# Patient Record
Sex: Female | Born: 1980 | Race: Black or African American | Hispanic: No | Marital: Single | Smoking: Never smoker
Health system: Southern US, Community
[De-identification: ages and names within clinical notes are randomized; demographics above are authoritative.]

## PROBLEM LIST (undated history)

## (undated) DIAGNOSIS — K649 Unspecified hemorrhoids: Secondary | ICD-10-CM

## (undated) DIAGNOSIS — M1990 Unspecified osteoarthritis, unspecified site: Secondary | ICD-10-CM

## (undated) DIAGNOSIS — T8859XA Other complications of anesthesia, initial encounter: Secondary | ICD-10-CM

## (undated) HISTORY — PX: TUBAL LIGATION: SHX77

## (undated) HISTORY — PX: OTHER SURGICAL HISTORY: SHX169

## (undated) HISTORY — PX: KNEE SURGERY: SHX244

---
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
## 1998-02-05 ENCOUNTER — Emergency Department (HOSPITAL_COMMUNITY): Admission: EM | Admit: 1998-02-05 | Discharge: 1998-02-05 | Payer: Self-pay | Admitting: Emergency Medicine

## 1998-05-17 ENCOUNTER — Emergency Department (HOSPITAL_COMMUNITY): Admission: EM | Admit: 1998-05-17 | Discharge: 1998-05-17 | Payer: Self-pay | Admitting: Emergency Medicine

## 2005-04-27 ENCOUNTER — Emergency Department (HOSPITAL_COMMUNITY): Admission: EM | Admit: 2005-04-27 | Discharge: 2005-04-27 | Payer: Self-pay | Admitting: *Deleted

## 2005-05-14 ENCOUNTER — Emergency Department (HOSPITAL_COMMUNITY): Admission: EM | Admit: 2005-05-14 | Discharge: 2005-05-15 | Payer: Self-pay | Admitting: Emergency Medicine

## 2005-07-26 ENCOUNTER — Emergency Department (HOSPITAL_COMMUNITY): Admission: EM | Admit: 2005-07-26 | Discharge: 2005-07-26 | Payer: Self-pay | Admitting: Family Medicine

## 2005-11-28 ENCOUNTER — Emergency Department (HOSPITAL_COMMUNITY): Admission: EM | Admit: 2005-11-28 | Discharge: 2005-11-29 | Payer: Self-pay | Admitting: Emergency Medicine

## 2005-12-19 ENCOUNTER — Other Ambulatory Visit: Admission: RE | Admit: 2005-12-19 | Discharge: 2005-12-19 | Payer: Self-pay | Admitting: Obstetrics and Gynecology

## 2006-04-01 ENCOUNTER — Inpatient Hospital Stay (HOSPITAL_COMMUNITY): Admission: AD | Admit: 2006-04-01 | Discharge: 2006-04-01 | Payer: Self-pay | Admitting: Obstetrics and Gynecology

## 2006-05-22 ENCOUNTER — Inpatient Hospital Stay (HOSPITAL_COMMUNITY): Admission: AD | Admit: 2006-05-22 | Discharge: 2006-05-22 | Payer: Self-pay | Admitting: Obstetrics and Gynecology

## 2006-05-26 ENCOUNTER — Inpatient Hospital Stay (HOSPITAL_COMMUNITY): Admission: AD | Admit: 2006-05-26 | Discharge: 2006-05-28 | Payer: Self-pay | Admitting: Obstetrics and Gynecology

## 2006-05-27 ENCOUNTER — Encounter (INDEPENDENT_AMBULATORY_CARE_PROVIDER_SITE_OTHER): Payer: Self-pay | Admitting: Specialist

## 2006-07-08 ENCOUNTER — Other Ambulatory Visit: Admission: RE | Admit: 2006-07-08 | Discharge: 2006-07-08 | Payer: Self-pay | Admitting: Obstetrics and Gynecology

## 2007-07-15 ENCOUNTER — Other Ambulatory Visit: Admission: RE | Admit: 2007-07-15 | Discharge: 2007-07-15 | Payer: Self-pay | Admitting: Obstetrics and Gynecology

## 2007-09-11 ENCOUNTER — Ambulatory Visit: Payer: Self-pay | Admitting: Family Medicine

## 2008-05-25 ENCOUNTER — Encounter: Admission: RE | Admit: 2008-05-25 | Discharge: 2008-05-25 | Payer: Self-pay | Admitting: Gastroenterology

## 2008-12-30 ENCOUNTER — Emergency Department (HOSPITAL_COMMUNITY): Admission: EM | Admit: 2008-12-30 | Discharge: 2008-12-30 | Payer: Self-pay | Admitting: Emergency Medicine

## 2009-04-03 ENCOUNTER — Emergency Department (HOSPITAL_COMMUNITY): Admission: EM | Admit: 2009-04-03 | Discharge: 2009-04-03 | Payer: Self-pay | Admitting: Emergency Medicine

## 2009-11-04 ENCOUNTER — Emergency Department (HOSPITAL_COMMUNITY): Admission: EM | Admit: 2009-11-04 | Discharge: 2009-11-04 | Payer: Self-pay | Admitting: Emergency Medicine

## 2009-12-15 ENCOUNTER — Emergency Department (HOSPITAL_COMMUNITY): Admission: EM | Admit: 2009-12-15 | Discharge: 2009-12-16 | Payer: Self-pay | Admitting: Emergency Medicine

## 2010-11-13 LAB — URINALYSIS, ROUTINE W REFLEX MICROSCOPIC
Bilirubin Urine: NEGATIVE
Glucose, UA: NEGATIVE mg/dL
Hgb urine dipstick: NEGATIVE
Ketones, ur: NEGATIVE mg/dL
Nitrite: NEGATIVE
Protein, ur: NEGATIVE mg/dL
Specific Gravity, Urine: 1.019 (ref 1.005–1.030)
Urobilinogen, UA: 0.2 mg/dL (ref 0.0–1.0)
pH: 6.5 (ref 5.0–8.0)

## 2010-11-13 LAB — PREGNANCY, URINE: Preg Test, Ur: NEGATIVE

## 2010-11-13 LAB — POCT I-STAT, CHEM 8
BUN: 12 mg/dL (ref 6–23)
Calcium, Ion: 1.1 mmol/L — ABNORMAL LOW (ref 1.12–1.32)
Chloride: 106 mEq/L (ref 96–112)
Creatinine, Ser: 0.7 mg/dL (ref 0.4–1.2)
Glucose, Bld: 94 mg/dL (ref 70–99)
HCT: 37 % (ref 36.0–46.0)
Hemoglobin: 12.6 g/dL (ref 12.0–15.0)
Potassium: 3.5 mEq/L (ref 3.5–5.1)
Sodium: 139 mEq/L (ref 135–145)
TCO2: 24 mmol/L (ref 0–100)

## 2010-12-01 LAB — URINALYSIS, ROUTINE W REFLEX MICROSCOPIC
Bilirubin Urine: NEGATIVE
Glucose, UA: NEGATIVE mg/dL
Hgb urine dipstick: NEGATIVE
Ketones, ur: NEGATIVE mg/dL
Nitrite: NEGATIVE
Protein, ur: NEGATIVE mg/dL
Specific Gravity, Urine: 1.026 (ref 1.005–1.030)
Urobilinogen, UA: 1 mg/dL (ref 0.0–1.0)
pH: 6.5 (ref 5.0–8.0)

## 2010-12-01 LAB — POCT I-STAT, CHEM 8
BUN: 10 mg/dL (ref 6–23)
Calcium, Ion: 1.19 mmol/L (ref 1.12–1.32)
Chloride: 103 mEq/L (ref 96–112)
Creatinine, Ser: 0.7 mg/dL (ref 0.4–1.2)
Glucose, Bld: 112 mg/dL — ABNORMAL HIGH (ref 70–99)
HCT: 38 % (ref 36.0–46.0)
Hemoglobin: 12.9 g/dL (ref 12.0–15.0)
Potassium: 3.6 mEq/L (ref 3.5–5.1)
Sodium: 140 mEq/L (ref 135–145)
TCO2: 26 mmol/L (ref 0–100)

## 2010-12-01 LAB — CBC
HCT: 36.1 % (ref 36.0–46.0)
Hemoglobin: 12.1 g/dL (ref 12.0–15.0)
MCHC: 33.6 g/dL (ref 30.0–36.0)
MCV: 87.7 fL (ref 78.0–100.0)
Platelets: 201 10*3/uL (ref 150–400)
RBC: 4.12 MIL/uL (ref 3.87–5.11)
RDW: 13.3 % (ref 11.5–15.5)
WBC: 6 10*3/uL (ref 4.0–10.5)

## 2010-12-01 LAB — DIFFERENTIAL
Basophils Absolute: 0 10*3/uL (ref 0.0–0.1)
Basophils Relative: 0 % (ref 0–1)
Eosinophils Absolute: 0 10*3/uL (ref 0.0–0.7)
Eosinophils Relative: 0 % (ref 0–5)
Lymphocytes Relative: 30 % (ref 12–46)
Lymphs Abs: 1.8 10*3/uL (ref 0.7–4.0)
Monocytes Absolute: 0.3 10*3/uL (ref 0.1–1.0)
Monocytes Relative: 5 % (ref 3–12)
Neutro Abs: 3.9 10*3/uL (ref 1.7–7.7)
Neutrophils Relative %: 64 % (ref 43–77)

## 2010-12-01 LAB — POCT PREGNANCY, URINE: Preg Test, Ur: NEGATIVE

## 2010-12-04 ENCOUNTER — Other Ambulatory Visit: Payer: Self-pay | Admitting: Obstetrics and Gynecology

## 2011-01-11 NOTE — Discharge Summary (Signed)
NAME:  Toni Stafford, Toni Stafford NO.:  [id number]   MEDICAL RECORD NO.:  [id number]          PATIENT TYPE:  INP   LOCATION:  9147                          FACILITY:  [facility]   PHYSICIAN:  Rudy Jew. Ashley Royalty, M.D.DATE OF BIRTH:  [birth year]   DATE OF ADMISSION:  05/26/2006  DATE OF DISCHARGE:  05/28/2006                                 DISCHARGE SUMMARY   DISCHARGE DIAGNOSES:  1. Intrauterine pregnancy at term, delivered.  2. Rh negative.  3. Ulcerative colitis.  4. Term birth, living child, vertex.  5. Desire for permanent surgical sterilization   OPERATIONS AND PROCEDURES:  1. Obstetrical delivery.  2. Postpartum bilateral tubal sterilization procedure (modified Pomeroy).   CONSULTATIONS:  None.   DISCHARGE MEDICATIONS:  1. Percocet.  2. Motrin 600 mg.   HISTORY AND PHYSICAL:  A 30 year old gravida 3, para 2, 38 weeks 3 days'  gestation.  Prenatal care complicated by the aforementioned diagnoses.  The  patient did receive RhoGAM at 28 weeks.  She presented to maternity  admissions complaining of contractions.  Initial cervical examination  revealed the cervix to be 8 cm dilated, 100% effaced, 0 station, vertex  presentation.  Artificial rupture of membranes was accomplished, which  revealed clear fluid.  For the remainder of the history and physical, please  see chart.   HOSPITAL COURSE:  The patient was admitted.  She went on to labor and  deliver on May 26, 2006.  The infant was a 6 pound 4 ounce female, Apgars 8  at one minute and 9 at five minutes, sent to the newborn nursery.  Delivery  was accomplished by Dr. Sylvester Harder over an intact perineum.  There were  no lacerations.   Postpartum, the patient stated a desire for attempt at permanent surgical  sterilization.  On May 27, 2006, she was taken to the operating room and  underwent postpartum bilateral tubal sterilization procedure (modified  Pomeroy).  The procedure was uncomplicated.  The patient was  evaluated on  May 28, 2006, and was felt to be a candidate for discharge.  She was  discharged home afebrile and in satisfactory condition.   DISPOSITION:  The patient is to return to [facility]  in 4-6 weeks for postpartum evaluation.      James A. Ashley Royalty, M.D.  Electronically Signed     JAM/MEDQ  D:  06/17/2006  T:  06/18/2006  Job:  [id number]

## 2011-01-11 NOTE — Op Note (Signed)
NAME:  Toni Stafford, Toni Stafford NO.:  [id number]   MEDICAL RECORD NO.:  [id number]          PATIENT TYPE:  INP   LOCATION:  9147                          FACILITY:  [facility]   PHYSICIAN:  Rudy Jew. Ashley Royalty, M.D.DATE OF BIRTH:  [birth year]   DATE OF PROCEDURE:  05/27/2006  DATE OF DISCHARGE:  05/28/2006                                 OPERATIVE REPORT   PREOPERATIVE DIAGNOSIS:  Desire for attempted permanent surgical  sterilization.   POSTOPERATIVE DIAGNOSIS:  Desire for attempted permanent surgical  sterilization.  Path pending.   PROCEDURE:  Postpartum bilateral tubal sterilization procedure (modified  Pomeroy).   SURGEON:  Rudy Jew. Ashley Royalty, M.D.   ANESTHESIA:  Epidural.   ESTIMATED BLOOD LOSS:  Less than 5 cc.   COMPLICATIONS:  None.   PACKS AND DRAINS:  None.   PROCEDURE:  The patient was taken to the operating room and placed in dorsal  supine position.  After epidural catheter was redosed with surgical levels  of anesthetic agent, the patient was prepped and draped in the usual manner  for abdominal surgery.  After satisfactory surgical levels were verified, an  infraumbilical incision was made in the transverse plane.  Subcutaneous  tissues were sharply dissected down to the fascia which was grasped with  hemostats and incised transversely with Mayo scissors.  [employer] retractors  were used to identify the underlying peritoneum which was elevated with  hemostats and entered atraumatically with Metzenbaum scissors.  [employer]  retractors were used to identify the right fallopian tube which was grasped  with Babcock clamp and traced to its fimbriated end.  An avascular area in  the distal isthmic to proximal ampullary portion was chosen for ligation.  Number one plain catgut suture was employed to ligate a section of tube in  this region.  A second ligature was placed as well.  The intervening portion  of fallopian tube was excised with Metzenbaum scissors and  submitted to  pathology for histologic studies.  Hemostasis was noted.  Next, the left  fallopian tube was identified, grasped with a Babcock clamp, and traced to  its fimbriated end.  An avascular area in the distal isthmic to proximal  ampullary portion was chosen for ligation.  Number one plain catgut suture  was employed to place a ligature on the tube in this region.  It was  securely tied.  A second ligature was placed as well and securely tied.  The  intervening portion of fallopian tube was excised with Metzenbaum scissors  and submitted to pathology for histologic studies.  Hemostasis was noted.   Next the fascia was closed with 0 Vicryl in a running fashion.  Skin was  closed with 3-0 Monocryl in a subcuticular fashion.  Approximately 10 cc of  a 0.25% Marcaine were instilled into the incision to aid in postoperative  analgesia.   At this point the patient was felt to have benefited maximally from the  procedure.  She was returned to the recovery room in excellent condition.      James A. Ashley Royalty, M.D.  Electronically Signed     JAM/MEDQ  D:  05/27/2006  T:  05/28/2006  Job:  [id number]

## 2011-06-26 ENCOUNTER — Other Ambulatory Visit: Payer: Self-pay | Admitting: Sports Medicine

## 2011-06-26 DIAGNOSIS — M25562 Pain in left knee: Secondary | ICD-10-CM

## 2011-06-28 ENCOUNTER — Ambulatory Visit
Admission: RE | Admit: 2011-06-28 | Discharge: 2011-06-28 | Disposition: A | Discharge status: Home / Self Care | Payer: Federal, State, Local not specified - PPO | Source: Ambulatory Visit | Attending: Sports Medicine | Admitting: Sports Medicine

## 2011-06-28 DIAGNOSIS — M25562 Pain in left knee: Secondary | ICD-10-CM

## 2011-07-22 ENCOUNTER — Other Ambulatory Visit: Payer: Self-pay | Admitting: Family Medicine

## 2011-07-22 DIAGNOSIS — R52 Pain, unspecified: Secondary | ICD-10-CM

## 2011-07-24 ENCOUNTER — Ambulatory Visit
Admission: RE | Admit: 2011-07-24 | Discharge: 2011-07-24 | Disposition: A | Discharge status: Home / Self Care | Payer: Federal, State, Local not specified - PPO | Source: Ambulatory Visit | Attending: Family Medicine | Admitting: Family Medicine

## 2011-07-24 ENCOUNTER — Other Ambulatory Visit: Payer: Self-pay | Admitting: Family Medicine

## 2011-07-24 ENCOUNTER — Other Ambulatory Visit: Payer: Federal, State, Local not specified - PPO

## 2011-07-24 DIAGNOSIS — R52 Pain, unspecified: Secondary | ICD-10-CM

## 2011-07-24 MED ORDER — IOHEXOL 300 MG/ML  SOLN
75.0000 mL | Freq: Once | INTRAMUSCULAR | Status: AC | PRN
  Administered 2011-07-24: 75 mL via INTRAVENOUS

## 2011-08-20 ENCOUNTER — Emergency Department (HOSPITAL_COMMUNITY): Payer: Federal, State, Local not specified - PPO

## 2011-08-20 ENCOUNTER — Encounter: Payer: Self-pay | Admitting: Emergency Medicine

## 2011-08-20 ENCOUNTER — Emergency Department (HOSPITAL_COMMUNITY)
Admission: EM | Admit: 2011-08-20 | Discharge: 2011-08-20 | Disposition: A | Discharge status: Home / Self Care | Payer: Federal, State, Local not specified - PPO | Attending: Emergency Medicine | Admitting: Emergency Medicine

## 2011-08-20 DIAGNOSIS — R05 Cough: Secondary | ICD-10-CM | POA: Insufficient documentation

## 2011-08-20 DIAGNOSIS — R110 Nausea: Secondary | ICD-10-CM | POA: Insufficient documentation

## 2011-08-20 DIAGNOSIS — J029 Acute pharyngitis, unspecified: Secondary | ICD-10-CM | POA: Insufficient documentation

## 2011-08-20 DIAGNOSIS — J3489 Other specified disorders of nose and nasal sinuses: Secondary | ICD-10-CM | POA: Insufficient documentation

## 2011-08-20 DIAGNOSIS — K5190 Ulcerative colitis, unspecified, without complications: Secondary | ICD-10-CM | POA: Insufficient documentation

## 2011-08-20 DIAGNOSIS — R5381 Other malaise: Secondary | ICD-10-CM | POA: Insufficient documentation

## 2011-08-20 DIAGNOSIS — H538 Other visual disturbances: Secondary | ICD-10-CM | POA: Insufficient documentation

## 2011-08-20 DIAGNOSIS — R6889 Other general symptoms and signs: Secondary | ICD-10-CM

## 2011-08-20 DIAGNOSIS — H5710 Ocular pain, unspecified eye: Secondary | ICD-10-CM | POA: Insufficient documentation

## 2011-08-20 DIAGNOSIS — R040 Epistaxis: Secondary | ICD-10-CM | POA: Insufficient documentation

## 2011-08-20 DIAGNOSIS — R51 Headache: Secondary | ICD-10-CM | POA: Insufficient documentation

## 2011-08-20 DIAGNOSIS — R059 Cough, unspecified: Secondary | ICD-10-CM | POA: Insufficient documentation

## 2011-08-20 HISTORY — DX: Unspecified osteoarthritis, unspecified site: M19.90

## 2011-08-20 LAB — CBC
HCT: 38.1 % (ref 36.0–46.0)
Hemoglobin: 13.3 g/dL (ref 12.0–15.0)
MCH: 29.4 pg (ref 26.0–34.0)
MCHC: 34.9 g/dL (ref 30.0–36.0)
MCV: 84.1 fL (ref 78.0–100.0)
Platelets: 158 10*3/uL (ref 150–400)
RBC: 4.53 MIL/uL (ref 3.87–5.11)
RDW: 12.9 % (ref 11.5–15.5)
WBC: 2.8 10*3/uL — ABNORMAL LOW (ref 4.0–10.5)

## 2011-08-20 LAB — POCT I-STAT, CHEM 8
BUN: 8 mg/dL (ref 6–23)
Calcium, Ion: 1.12 mmol/L (ref 1.12–1.32)
Chloride: 104 mEq/L (ref 96–112)
Creatinine, Ser: 0.7 mg/dL (ref 0.50–1.10)
Glucose, Bld: 96 mg/dL (ref 70–99)
HCT: 41 % (ref 36.0–46.0)
Hemoglobin: 13.9 g/dL (ref 12.0–15.0)
Potassium: 3.7 mEq/L (ref 3.5–5.1)
Sodium: 140 mEq/L (ref 135–145)
TCO2: 27 mmol/L (ref 0–100)

## 2011-08-20 LAB — DIFFERENTIAL
Basophils Absolute: 0 10*3/uL (ref 0.0–0.1)
Basophils Relative: 0 % (ref 0–1)
Eosinophils Absolute: 0 10*3/uL (ref 0.0–0.7)
Eosinophils Relative: 0 % (ref 0–5)
Lymphocytes Relative: 38 % (ref 12–46)
Lymphs Abs: 1.1 10*3/uL (ref 0.7–4.0)
Monocytes Absolute: 0.3 10*3/uL (ref 0.1–1.0)
Monocytes Relative: 10 % (ref 3–12)
Neutro Abs: 1.5 10*3/uL — ABNORMAL LOW (ref 1.7–7.7)
Neutrophils Relative %: 52 % (ref 43–77)

## 2011-08-20 MED ORDER — PROPARACAINE HCL 0.5 % OP SOLN
1.0000 [drp] | Freq: Once | OPHTHALMIC | Status: DC
  Filled 2011-08-20: qty 15

## 2011-08-20 MED ORDER — SODIUM CHLORIDE 0.9 % IV BOLUS (SEPSIS)
1000.0000 mL | Freq: Once | INTRAVENOUS | Status: AC
  Administered 2011-08-20: 1000 mL via INTRAVENOUS

## 2011-08-20 MED ORDER — ONDANSETRON HCL 4 MG/2ML IJ SOLN
4.0000 mg | Freq: Once | INTRAMUSCULAR | Status: AC
  Administered 2011-08-20: 4 mg via INTRAVENOUS
  Filled 2011-08-20: qty 2

## 2011-08-20 MED ORDER — MORPHINE SULFATE 4 MG/ML IJ SOLN
4.0000 mg | Freq: Once | INTRAMUSCULAR | Status: AC
  Administered 2011-08-20: 4 mg via INTRAVENOUS
  Filled 2011-08-20: qty 1

## 2011-08-20 MED ORDER — HYDROCODONE-ACETAMINOPHEN 5-325 MG PO TABS: 1.0000 | ORAL_TABLET | Freq: Four times a day (QID) | ORAL | Status: AC | PRN

## 2011-08-20 MED ORDER — TETRACAINE HCL 0.5 % OP SOLN
OPHTHALMIC | Status: AC
  Filled 2011-08-20: qty 2

## 2011-08-20 NOTE — ED Notes (Signed)
Pt reports that she woke up this am with a bad nose bleed and since she has been having a bad headache. Pt reports headache is a stabbing pain and reports she is having blurred vision.

## 2011-08-20 NOTE — ED Provider Notes (Signed)
History     CSN: [id number]  Arrival date & time 08/20/11  2130   First MD Initiated Contact with Patient 08/20/11 [phone]      Chief Complaint  Patient presents with  . Headache    (Consider location/radiation/quality/duration/timing/severity/associated sxs/prior treatment) HPI Comments: Pt c/o severe HA, "worst HA ever & I normally don't suffer from these."  She woke up this am having a nosebleed, right eye pain/pressure. States she had an episode of 2-3 min of bilateral dark vision and that her right eye has had blurred vision ever since. HA worse lying down.In addition pt has had flu like s/s x 2 days; cough, sore throat, nasal congestion, muscle fatigue.   No cardiac hx, HTN, DM, Steroid use. Pt states she is not on medications.  PCP: Dr. Melven Sartorius  Patient is a 30 y.o. female presenting with headaches. The history is provided by the patient.  Headache  This is a new problem. The current episode started 3 to 5 hours ago. The problem occurs constantly. The problem has not changed since onset.The headache is associated with nothing. The pain is located in the frontal and right unilateral region. The quality of the pain is described as dull. The pain is at a severity of 8/10. The pain radiates to the face. Associated symptoms include nausea. Pertinent negatives include no anorexia, no fever, no malaise/fatigue, no chest pressure, no near-syncope, no orthopnea, no palpitations, no syncope, no shortness of breath and no vomiting. She has tried nothing for the symptoms.    Past Medical History  Diagnosis Date  . Ulcerative colitis   . Arthritis     Past Surgical History  Procedure Date  . Knee surgery     No family history on file.  History  Substance Use Topics  . Smoking status: Not on file  . Smokeless tobacco: Not on file  . Alcohol Use:     OB History    Grav Para Term Preterm Abortions TAB SAB Ect Mult Living                  Review of Systems  Constitutional:  Positive for chills and fatigue. Negative for fever, malaise/fatigue and diaphoresis.  HENT: Positive for nosebleeds, congestion and sore throat. Negative for ear pain, facial swelling, rhinorrhea, drooling, mouth sores, neck pain, neck stiffness and voice change.   Eyes: Positive for visual disturbance. Negative for photophobia, pain and redness.  Respiratory: Positive for cough and chest tightness. Negative for shortness of breath, wheezing and stridor.   Cardiovascular: Negative for chest pain, palpitations, orthopnea, leg swelling, syncope and near-syncope.  Gastrointestinal: Positive for nausea. Negative for vomiting, abdominal pain, diarrhea, constipation and anorexia.  Genitourinary: Negative for dysuria.  Musculoskeletal: Positive for myalgias. Negative for back pain and gait problem.  Skin: Negative for color change and rash.  Neurological: Positive for headaches. Negative for dizziness, facial asymmetry, speech difficulty, weakness, light-headedness and numbness.  All other systems reviewed and are negative.    Allergies  Review of patient's allergies indicates no known allergies.  Home Medications   Current Outpatient Rx  Name Route Sig Dispense Refill  . IBUPROFEN 200 MG PO TABS Oral Take 200 mg by mouth every 6 (six) hours as needed. For pain       BP 113/86  Pulse 71  Temp(Src) 97.6 F (36.4 C) (Oral)  Resp 12  SpO2 100%  LMP 07/25/2011  Physical Exam  Nursing note and vitals reviewed. Constitutional: She is oriented to person, place,  and time. She appears well-nourished. No distress.  HENT:  Head: Normocephalic and atraumatic. No trismus in the jaw.  Right Ear: External ear normal. No drainage or tenderness. No mastoid tenderness.  Left Ear: External ear normal. No drainage or tenderness. No mastoid tenderness.  Nose: Nose normal. No rhinorrhea or sinus tenderness.  Mouth/Throat: Uvula is midline, oropharynx is clear and moist and mucous membranes are normal. No  uvula swelling. No oropharyngeal exudate.  Eyes: Conjunctivae and EOM are normal. Pupils are equal, round, and reactive to light. Right eye exhibits no discharge. Left eye exhibits no discharge. No scleral icterus.  Neck: Normal range of motion. Neck supple.  Cardiovascular: Normal rate, regular rhythm and normal heart sounds.   Pulmonary/Chest: Effort normal and breath sounds normal. No stridor. No respiratory distress. She has no wheezes. She exhibits tenderness.  Abdominal: Soft. There is no tenderness.  Musculoskeletal: Normal range of motion.  Neurological: She is alert and oriented to person, place, and time. She has normal strength. No cranial nerve deficit or sensory deficit. She displays a negative Romberg sign. Coordination and gait normal. GCS eye subscore is 4. GCS verbal subscore is 5. GCS motor subscore is 6.  Skin: Skin is warm and dry. No rash noted. She is not diaphoretic.  Psychiatric: She has a normal mood and affect. Her behavior is normal.    ED Course  Procedures (including critical care time)  Labs Reviewed  CBC - Abnormal; Notable for the following:    WBC 2.8 (*)    All other components within normal limits  DIFFERENTIAL - Abnormal; Notable for the following:    Neutro Abs 1.5 (*)    All other components within normal limits  POCT I-STAT, CHEM 8  I-STAT, CHEM 8   Dg Chest 2 View  08/20/2011  *RADIOLOGY REPORT*  Clinical Data: Fever, coughing up blood, shortness of breath  CHEST - 2 VIEW  Comparison: Chest x-ray of 04/27/2005  Findings: The lungs remain clear and hyperaerated.  Slightly prominent perihilar markings with peribronchial thickening may indicate bronchitis or asthma.  Mediastinal contours are stable. The heart is within normal limits in size.  No bony abnormality is seen.  IMPRESSION: Hyperaeration and peribronchial thickening suggest bronchitis or asthma.  No pneumonia.  Original Report Authenticated By: Juline Patch, M.D.   Ct Head Wo  Contrast  08/20/2011  *RADIOLOGY REPORT*  Clinical Data: Frontal headache, nosebleed, nausea  CT HEAD WITHOUT CONTRAST  Technique:  Contiguous axial images were obtained from the base of the skull through the vertex without contrast.  Comparison: None.  Findings: The ventricular system is normal in size and confusion configuration, and the septum is in a normal midline position.  The fourth ventricle and basilar cisterns are unremarkable.  No hemorrhage, mass lesion, or acute infarction is seen.  On bone window images, the paranasal sinuses are clear.  No calvarial abnormality is seen.  IMPRESSION: Negative unenhanced CT of the brain.  Original Report Authenticated By: Juline Patch, M.D.   Note the patient had a CT maxillofacial performed one month ago by her primary care physician for sinus type pressure of the right side of her face.  This is currently being followed by him.  During the patient's hospital stay intraocular pressure was checked in both the right and left eye.  There were no discrepancies between both eyes and right intraocular pressure averaged at 16 (3 readings taken)   Patient will be given an ophthalmologist to followup with her discharge as  well.  She's been instructed to followup with them if her visual symptoms persist or become concerning. No diagnosis found.  Do to negative radiological findings including chest x-ray and CT of head patient will be discharged with flulike symptoms and diagnosis and symptomatic treatment.  It is recommended that she followup with her primary care physician.  She will also be given instructions on epistaxis in case she has a future nosebleed.  This patient has been discussed with Dr. Lynelle Doctor who agrees with my plan to discharge patient with symptomatic treatment.  MDM  Flu-like symptoms, sinus pressure        [place], [place] 08/21/11 1559

## 2011-08-23 NOTE — ED Provider Notes (Signed)
Despite pt's symptoms of worst headache ever history not suggestive of subarachnoid hemorrhage.  Pt has flu like symptoms.  No neck stiffness to suggest meningitis.  Pt was seen ambulating around the ED in no distress without difficulty.   Medical screening examination/treatment/procedure(s) were performed by non-physician practitioner and as supervising physician I was immediately available for consultation/collaboration.   Celene Kras, MD 08/23/11 [phone]

## 2011-12-30 ENCOUNTER — Ambulatory Visit
Admission: RE | Admit: 2011-12-30 | Discharge: 2011-12-30 | Disposition: A | Discharge status: Home / Self Care | Payer: Federal, State, Local not specified - PPO | Source: Ambulatory Visit | Attending: Rheumatology | Admitting: Rheumatology

## 2011-12-30 ENCOUNTER — Other Ambulatory Visit: Payer: Self-pay | Admitting: Rheumatology

## 2011-12-30 DIAGNOSIS — Z79899 Other long term (current) drug therapy: Secondary | ICD-10-CM

## 2012-12-16 ENCOUNTER — Other Ambulatory Visit: Payer: Self-pay | Admitting: Obstetrics and Gynecology

## 2013-04-11 ENCOUNTER — Encounter (HOSPITAL_COMMUNITY): Payer: Self-pay | Admitting: *Deleted

## 2013-04-11 ENCOUNTER — Emergency Department (HOSPITAL_COMMUNITY)
Admission: EM | Admit: 2013-04-11 | Discharge: 2013-04-11 | Disposition: A | Discharge status: Home / Self Care | Payer: Federal, State, Local not specified - PPO | Attending: Emergency Medicine | Admitting: Emergency Medicine

## 2013-04-11 DIAGNOSIS — Z8719 Personal history of other diseases of the digestive system: Secondary | ICD-10-CM | POA: Insufficient documentation

## 2013-04-11 DIAGNOSIS — R220 Localized swelling, mass and lump, head: Secondary | ICD-10-CM | POA: Insufficient documentation

## 2013-04-11 DIAGNOSIS — Z8739 Personal history of other diseases of the musculoskeletal system and connective tissue: Secondary | ICD-10-CM | POA: Insufficient documentation

## 2013-04-11 MED ORDER — OXYCODONE-ACETAMINOPHEN 5-325 MG PO TABS: 2.0000 | ORAL_TABLET | ORAL | Status: DC | PRN

## 2013-04-11 MED ORDER — OXYCODONE-ACETAMINOPHEN 5-325 MG PO TABS
2.0000 | ORAL_TABLET | Freq: Once | ORAL | Status: AC
  Administered 2013-04-11: 2 via ORAL
  Filled 2013-04-11: qty 2

## 2013-04-11 MED ORDER — AMOXICILLIN-POT CLAVULANATE 500-125 MG PO TABS: 1.0000 | ORAL_TABLET | Freq: Three times a day (TID) | ORAL | Status: DC

## 2013-04-11 NOTE — ED Notes (Signed)
Delo ,MD at bedside 

## 2013-04-11 NOTE — ED Provider Notes (Signed)
  CSN: [id number]     Arrival date & time 04/11/13  0014 History     First MD Initiated Contact with Patient 04/11/13 [phone]     Chief Complaint  Patient presents with  . Jaw Pain   (Consider location/radiation/quality/duration/timing/severity/associated sxs/prior Treatment) HPI Comments: Patient is a 32 year old female with past medical history significant for rheumatoid arthritis and Crohn's disease. She received a Remicade infusion yesterday. She started earlier today with complaints of pain and swelling in her right lower jaw. She denies any injury or trauma denies any fever or chills. She is having difficulty chewing and opening and closing her mouth due to this. Her symptoms are worsened with opening her mouth and resolved with closing her mouth. It is also worsened with palpation of the side of her face.  The history is provided by the patient.    Past Medical History  Diagnosis Date  . Ulcerative colitis   . Arthritis    Past Surgical History  Procedure Laterality Date  . Knee surgery     No family history on file. History  Substance Use Topics  . Smoking status: Never Smoker   . Smokeless tobacco: Not on file  . Alcohol Use: Yes   OB History   Grav Para Term Preterm Abortions TAB SAB Ect Mult Living                 Review of Systems  All other systems reviewed and are negative.    Allergies  Review of patient's allergies indicates no known allergies.  Home Medications   Current Outpatient Rx  Name  Route  Sig  Dispense  Refill  . ibuprofen (ADVIL) 200 MG tablet   Oral   Take 200 mg by mouth every 6 (six) hours as needed. For pain           BP 109/68  Pulse 82  Temp(Src) 98.5 F (36.9 C) (Oral)  Resp 16  SpO2 100% Physical Exam  Nursing note and vitals reviewed. Constitutional: She is oriented to person, place, and time. She appears well-developed and well-nourished. No distress.  HENT:  Head: Normocephalic and atraumatic.  Mouth/Throat:  Oropharynx is clear and moist.  There is fullness and tenderness to palpation over the angle of the mandible. There is no tonsillar exudate and no tonsillar deviation that would be suggestive of a peritonsillar abscess. She is able to swallow without difficulty.  Neck: Normal range of motion. Neck supple.  Cardiovascular: Normal rate and regular rhythm.   Pulmonary/Chest: Effort normal and breath sounds normal.  Lymphadenopathy:    She has no cervical adenopathy.  Neurological: She is alert and oriented to person, place, and time.  Skin: Skin is warm and dry. She is not diaphoretic.    ED Course   Procedures (including critical care time)  Labs Reviewed - No data to display No results found. No diagnosis found.  MDM  Swelling to the face appears to either be parotitis or possibly an infection with a dental etiology. The Remicade treatments make her more susceptible to infection and I feel as though an antibiotic is appropriate in this situation. I will treat her with Augmentin and pain medications. There is no evidence of airway involvement and I feel as though she is stable for discharge. She is to call her rheumatologist on Monday to arrange followup up.  Geoffery Lyons, MD 04/11/13 [phone]

## 2013-04-11 NOTE — ED Notes (Addendum)
Pt states that she has noticed swelling in her jaw yesterday afternoon. Pt's jaw is swollen and tender to the touch. This RN does not notice any signs of swelling in the tongue, and breath sounds are clear. Pt denies feeling swelling in her throat or SOB. Pt is unable to open her mouth very widely.

## 2013-04-11 NOTE — ED Notes (Signed)
The pt has had bi-lateral jaw pain  With swelling since 1330 today.  She is having difficulty eating and she reports that the swelling and the pain is getting worse.  She had a remicade  Dose iv yesterday and this is only the second dose of that  She has had.  She is also taking methotrexate .  No difficulty breathing.  She can open her mouth only a small  space

## 2013-05-25 ENCOUNTER — Ambulatory Visit
Admission: RE | Admit: 2013-05-25 | Discharge: 2013-05-25 | Disposition: A | Discharge status: Home / Self Care | Payer: Federal, State, Local not specified - PPO | Source: Ambulatory Visit | Attending: Gastroenterology | Admitting: Gastroenterology

## 2013-05-25 ENCOUNTER — Other Ambulatory Visit: Payer: Self-pay | Admitting: Gastroenterology

## 2013-05-25 DIAGNOSIS — R109 Unspecified abdominal pain: Secondary | ICD-10-CM

## 2013-05-25 MED ORDER — IOHEXOL 300 MG/ML  SOLN
30.0000 mL | Freq: Once | INTRAMUSCULAR | Status: AC | PRN
  Administered 2013-05-25: 30 mL via ORAL

## 2013-05-25 MED ORDER — IOHEXOL 300 MG/ML  SOLN
100.0000 mL | Freq: Once | INTRAMUSCULAR | Status: AC | PRN
  Administered 2013-05-25: 100 mL via INTRAVENOUS

## 2013-06-10 ENCOUNTER — Other Ambulatory Visit: Payer: Self-pay | Admitting: Gastroenterology

## 2013-06-10 DIAGNOSIS — R109 Unspecified abdominal pain: Secondary | ICD-10-CM

## 2013-06-24 ENCOUNTER — Encounter (HOSPITAL_COMMUNITY)
Admission: RE | Admit: 2013-06-24 | Discharge: 2013-06-24 | Disposition: A | Discharge status: Home / Self Care | Payer: Federal, State, Local not specified - PPO | Source: Ambulatory Visit | Attending: Gastroenterology | Admitting: Gastroenterology

## 2013-06-24 ENCOUNTER — Ambulatory Visit (HOSPITAL_COMMUNITY)
Admission: RE | Admit: 2013-06-24 | Discharge: 2013-06-24 | Disposition: A | Discharge status: Home / Self Care | Payer: Federal, State, Local not specified - PPO | Source: Ambulatory Visit | Attending: Gastroenterology | Admitting: Gastroenterology

## 2013-06-24 DIAGNOSIS — R109 Unspecified abdominal pain: Secondary | ICD-10-CM | POA: Insufficient documentation

## 2013-06-24 MED ORDER — SINCALIDE 5 MCG IJ SOLR
0.0200 ug/kg | Freq: Once | INTRAMUSCULAR | Status: AC
  Administered 2013-06-24: 0.95 ug via INTRAVENOUS

## 2013-06-24 MED ORDER — TECHNETIUM TC 99M MEBROFENIN IV KIT
5.0000 | PACK | Freq: Once | INTRAVENOUS | Status: AC | PRN
  Administered 2013-06-24: 5 via INTRAVENOUS

## 2013-06-24 MED ORDER — STERILE WATER FOR INJECTION IJ SOLN
INTRAMUSCULAR | Status: AC
  Filled 2013-06-24: qty 10

## 2013-06-24 MED ORDER — SINCALIDE 5 MCG IJ SOLR
INTRAMUSCULAR | Status: AC
  Filled 2013-06-24: qty 5

## 2013-12-23 ENCOUNTER — Other Ambulatory Visit: Payer: Self-pay | Admitting: Obstetrics and Gynecology

## 2014-03-30 ENCOUNTER — Other Ambulatory Visit: Payer: Self-pay | Admitting: Obstetrics and Gynecology

## 2014-03-30 DIAGNOSIS — N6452 Nipple discharge: Secondary | ICD-10-CM

## 2014-03-30 DIAGNOSIS — N6320 Unspecified lump in the left breast, unspecified quadrant: Secondary | ICD-10-CM

## 2014-04-05 ENCOUNTER — Encounter (INDEPENDENT_AMBULATORY_CARE_PROVIDER_SITE_OTHER): Payer: Self-pay

## 2014-04-05 ENCOUNTER — Ambulatory Visit
Admission: RE | Admit: 2014-04-05 | Discharge: 2014-04-05 | Disposition: A | Discharge status: Home / Self Care | Payer: Federal, State, Local not specified - PPO | Source: Ambulatory Visit | Attending: Obstetrics and Gynecology | Admitting: Obstetrics and Gynecology

## 2014-04-05 ENCOUNTER — Other Ambulatory Visit: Payer: Self-pay | Admitting: Obstetrics and Gynecology

## 2014-04-05 DIAGNOSIS — N6320 Unspecified lump in the left breast, unspecified quadrant: Secondary | ICD-10-CM

## 2014-04-05 DIAGNOSIS — N6452 Nipple discharge: Secondary | ICD-10-CM

## 2014-04-27 ENCOUNTER — Emergency Department (HOSPITAL_COMMUNITY)
Admission: EM | Admit: 2014-04-27 | Discharge: 2014-04-28 | Disposition: A | Discharge status: Home / Self Care | Payer: Federal, State, Local not specified - PPO | Attending: Emergency Medicine | Admitting: Emergency Medicine

## 2014-04-27 ENCOUNTER — Encounter (HOSPITAL_COMMUNITY): Payer: Self-pay | Admitting: Emergency Medicine

## 2014-04-27 DIAGNOSIS — Z3202 Encounter for pregnancy test, result negative: Secondary | ICD-10-CM | POA: Insufficient documentation

## 2014-04-27 DIAGNOSIS — R1084 Generalized abdominal pain: Secondary | ICD-10-CM | POA: Diagnosis present

## 2014-04-27 DIAGNOSIS — R197 Diarrhea, unspecified: Secondary | ICD-10-CM | POA: Insufficient documentation

## 2014-04-27 DIAGNOSIS — Z79899 Other long term (current) drug therapy: Secondary | ICD-10-CM | POA: Diagnosis not present

## 2014-04-27 DIAGNOSIS — R112 Nausea with vomiting, unspecified: Secondary | ICD-10-CM | POA: Diagnosis not present

## 2014-04-27 DIAGNOSIS — M129 Arthropathy, unspecified: Secondary | ICD-10-CM | POA: Diagnosis not present

## 2014-04-27 DIAGNOSIS — Z8719 Personal history of other diseases of the digestive system: Secondary | ICD-10-CM | POA: Diagnosis not present

## 2014-04-27 LAB — COMPREHENSIVE METABOLIC PANEL
ALT: 28 U/L (ref 0–35)
AST: 28 U/L (ref 0–37)
Albumin: 5.4 g/dL — ABNORMAL HIGH (ref 3.5–5.2)
Alkaline Phosphatase: 72 U/L (ref 39–117)
Anion gap: 20 — ABNORMAL HIGH (ref 5–15)
BUN: 22 mg/dL (ref 6–23)
CO2: 16 mEq/L — ABNORMAL LOW (ref 19–32)
Calcium: 10.3 mg/dL (ref 8.4–10.5)
Chloride: 100 mEq/L (ref 96–112)
Creatinine, Ser: 1.05 mg/dL (ref 0.50–1.10)
GFR calc Af Amer: 79 mL/min — ABNORMAL LOW (ref >90)
GFR calc non Af Amer: 68 mL/min — ABNORMAL LOW (ref >90)
Glucose, Bld: 103 mg/dL — ABNORMAL HIGH (ref 70–99)
Potassium: 4.3 mEq/L (ref 3.7–5.3)
Sodium: 136 mEq/L — ABNORMAL LOW (ref 137–147)
Total Bilirubin: 0.7 mg/dL (ref 0.3–1.2)
Total Protein: 10.3 g/dL — ABNORMAL HIGH (ref 6.0–8.3)

## 2014-04-27 LAB — CBC WITH DIFFERENTIAL/PLATELET
Basophils Absolute: 0 10*3/uL (ref 0.0–0.1)
Basophils Relative: 0 % (ref 0–1)
Eosinophils Absolute: 0 10*3/uL (ref 0.0–0.7)
Eosinophils Relative: 0 % (ref 0–5)
HCT: 38.6 % (ref 36.0–46.0)
Hemoglobin: 13.4 g/dL (ref 12.0–15.0)
Lymphocytes Relative: 31 % (ref 12–46)
Lymphs Abs: 1.7 10*3/uL (ref 0.7–4.0)
MCH: 31.4 pg (ref 26.0–34.0)
MCHC: 34.7 g/dL (ref 30.0–36.0)
MCV: 90.4 fL (ref 78.0–100.0)
Monocytes Absolute: 0.3 10*3/uL (ref 0.1–1.0)
Monocytes Relative: 6 % (ref 3–12)
Neutro Abs: 3.5 10*3/uL (ref 1.7–7.7)
Neutrophils Relative %: 63 % (ref 43–77)
Platelets: 230 10*3/uL (ref 150–400)
RBC: 4.27 MIL/uL (ref 3.87–5.11)
RDW: 13.2 % (ref 11.5–15.5)
WBC: 5.5 10*3/uL (ref 4.0–10.5)

## 2014-04-27 LAB — BASIC METABOLIC PANEL
Anion gap: 17 — ABNORMAL HIGH (ref 5–15)
BUN: 24 mg/dL — ABNORMAL HIGH (ref 6–23)
CO2: 17 mEq/L — ABNORMAL LOW (ref 19–32)
Calcium: 8.9 mg/dL (ref 8.4–10.5)
Chloride: 103 mEq/L (ref 96–112)
Creatinine, Ser: 0.92 mg/dL (ref 0.50–1.10)
GFR calc Af Amer: >90 mL/min (ref >90)
GFR calc non Af Amer: 80 mL/min — ABNORMAL LOW (ref >90)
Glucose, Bld: 105 mg/dL — ABNORMAL HIGH (ref 70–99)
Potassium: 4.2 mEq/L (ref 3.7–5.3)
Sodium: 137 mEq/L (ref 137–147)

## 2014-04-27 LAB — URINALYSIS, ROUTINE W REFLEX MICROSCOPIC
Glucose, UA: NEGATIVE mg/dL
Ketones, ur: NEGATIVE mg/dL
Leukocytes, UA: NEGATIVE
Nitrite: NEGATIVE
Protein, ur: 100 mg/dL — AB
Specific Gravity, Urine: 1.028 (ref 1.005–1.030)
Urobilinogen, UA: 0.2 mg/dL (ref 0.0–1.0)
pH: 5 (ref 5.0–8.0)

## 2014-04-27 LAB — LIPASE, BLOOD: Lipase: 22 U/L (ref 11–59)

## 2014-04-27 LAB — I-STAT TROPONIN, ED: Troponin i, poc: 0 ng/mL (ref 0.00–0.08)

## 2014-04-27 LAB — POC URINE PREG, ED: Preg Test, Ur: NEGATIVE

## 2014-04-27 MED ORDER — MORPHINE SULFATE 4 MG/ML IJ SOLN
4.0000 mg | Freq: Once | INTRAMUSCULAR | Status: AC | PRN
  Filled 2014-04-27: qty 1

## 2014-04-27 MED ORDER — MORPHINE SULFATE 4 MG/ML IJ SOLN
4.0000 mg | Freq: Once | INTRAMUSCULAR | Status: AC
  Administered 2014-04-27: 4 mg via INTRAVENOUS
  Filled 2014-04-27: qty 1

## 2014-04-27 MED ORDER — ONDANSETRON HCL 4 MG/2ML IJ SOLN
4.0000 mg | Freq: Once | INTRAMUSCULAR | Status: AC
  Administered 2014-04-27: 4 mg via INTRAVENOUS
  Filled 2014-04-27: qty 2

## 2014-04-27 MED ORDER — SODIUM CHLORIDE 0.9 % IV BOLUS (SEPSIS)
1000.0000 mL | Freq: Once | INTRAVENOUS | Status: AC
  Administered 2014-04-28: 1000 mL via INTRAVENOUS

## 2014-04-27 MED ORDER — SODIUM CHLORIDE 0.9 % IV BOLUS (SEPSIS)
1000.0000 mL | Freq: Once | INTRAVENOUS | Status: AC
  Administered 2014-04-27: 1000 mL via INTRAVENOUS

## 2014-04-27 NOTE — ED Provider Notes (Signed)
CSN: [id number]     Arrival date & time 04/27/14  1902 History   First MD Initiated Contact with Patient 04/27/14 2015     Chief Complaint  Patient presents with  . Abdominal Pain    The patient has been having nausea, vomiting, and diarrhea.  She says she has not been able to keep anything down and has had 17 instances of diarrhea.       (Consider location/radiation/quality/duration/timing/severity/associated sxs/prior Treatment) HPI Comments: Patient is a 33 yo F PMHx significant for Ulcerative Colitis, RA, Sjogrens Syndome presenting to the ED for acute onset of nausea, 3-4 episode of non-bloody non-bilious emesis, 3-4 episodes of non-bloody diarrhea, and generalized abdominal pain since 5AM this morning. No alleviating factors. Eating and drinking aggravate her symptoms. No sick contacts. No abdominal surgical history. Last colonoscopy was 1 year ago, UC was in remission. GI: Dr. Loreta Ave.   Patient is a 33 y.o. female presenting with abdominal pain.  Abdominal Pain Associated symptoms: diarrhea, nausea and vomiting   Associated symptoms: no chills and no fever     Past Medical History  Diagnosis Date  . Ulcerative colitis   . Arthritis    Past Surgical History  Procedure Laterality Date  . Knee surgery     History reviewed. No pertinent family history. History  Substance Use Topics  . Smoking status: Never Smoker   . Smokeless tobacco: Not on file  . Alcohol Use: Yes   OB History   Grav Para Term Preterm Abortions TAB SAB Ect Mult Living                 Review of Systems  Constitutional: Negative for fever and chills.  Gastrointestinal: Positive for nausea, vomiting, abdominal pain and diarrhea. Negative for blood in stool, abdominal distention and anal bleeding.  All other systems reviewed and are negative.     Allergies  Review of patient's allergies indicates no known allergies.  Home Medications   Prior to Admission medications   Medication Sig Start Date End  Date Taking? Authorizing Provider  folic acid (FOLVITE) 1 MG tablet Take 1 mg by mouth daily.   Yes Historical Provider, MD  ibuprofen (ADVIL) 200 MG tablet Take 200 mg by mouth every 6 (six) hours as needed. For pain    Yes Historical Provider, MD  Multiple Vitamin (MULTIVITAMIN WITH MINERALS) TABS tablet Take 1 tablet by mouth daily.   Yes Historical Provider, MD   BP 107/70  Pulse 74  Temp(Src) 98.1 F (36.7 C) (Oral)  Resp 20  SpO2 99% Physical Exam  Nursing note and vitals reviewed. Constitutional: She is oriented to person, place, and time. She appears well-developed and well-nourished. No distress.  HENT:  Head: Normocephalic and atraumatic.  Right Ear: External ear normal.  Left Ear: External ear normal.  Nose: Nose normal.  Mouth/Throat: Uvula is midline and oropharynx is clear and moist. Mucous membranes are dry.  Eyes: Conjunctivae are normal.  Neck: Neck supple.  Cardiovascular: Normal rate, regular rhythm and normal heart sounds.   Pulmonary/Chest: Effort normal and breath sounds normal. No respiratory distress.  Abdominal: Soft. Normal appearance and bowel sounds are normal. She exhibits no distension. There is generalized tenderness. There is no rigidity, no rebound and no guarding.  Neurological: She is alert and oriented to person, place, and time.  Skin: Skin is warm and dry. She is not diaphoretic.    ED Course  Procedures (including critical care time) Medications  morphine 4 MG/ML injection 4 mg (  not administered)  ondansetron (ZOFRAN) injection 4 mg (not administered)  sodium chloride 0.9 % bolus 1,000 mL (1,000 mLs Intravenous New Bag/Given 04/27/14 2135)  ondansetron (ZOFRAN) injection 4 mg (4 mg Intravenous Given 04/27/14 2135)  morphine 4 MG/ML injection 4 mg (4 mg Intravenous Given 04/27/14 2135)  sodium chloride 0.9 % bolus 1,000 mL (1,000 mLs Intravenous New Bag/Given 04/28/14 0020)    Labs Review Labs Reviewed  COMPREHENSIVE METABOLIC PANEL - Abnormal;  Notable for the following:    Sodium 136 (*)    CO2 16 (*)    Glucose, Bld 103 (*)    Total Protein 10.3 (*)    Albumin 5.4 (*)    GFR calc non Af Amer 68 (*)    GFR calc Af Amer 79 (*)    Anion gap 20 (*)    All other components within normal limits  URINALYSIS, ROUTINE W REFLEX MICROSCOPIC - Abnormal; Notable for the following:    Color, Urine AMBER (*)    APPearance HAZY (*)    Hgb urine dipstick MODERATE (*)    Bilirubin Urine MODERATE (*)    Protein, ur 100 (*)    All other components within normal limits  URINE MICROSCOPIC-ADD ON - Abnormal; Notable for the following:    Squamous Epithelial / LPF FEW (*)    Bacteria, UA FEW (*)    Casts HYALINE CASTS (*)    All other components within normal limits  BASIC METABOLIC PANEL - Abnormal; Notable for the following:    CO2 17 (*)    Glucose, Bld 105 (*)    BUN 24 (*)    GFR calc non Af Amer 80 (*)    Anion gap 17 (*)    All other components within normal limits  CBC WITH DIFFERENTIAL  LIPASE, BLOOD  BASIC METABOLIC PANEL  I-STAT TROPOININ, ED  POC URINE PREG, ED    Imaging Review No results found.   EKG Interpretation None      MDM   Final diagnoses:  None    Filed Vitals:   04/27/14 2332  BP:   Pulse:   Temp: 98.1 F (36.7 C)  Resp:    Afebrile, NAD, non-toxic appearing, AAOx4.   Patient is nontoxic, nonseptic appearing, in no apparent distress.  Patient's pain and other symptoms adequately managed in emergency department.  Fluid bolus given.  Labs, imaging and vitals reviewed.  Patient does not meet the SIRS or Sepsis criteria.  On repeat exam patient does not have a surgical abdomin and there are nor peritoneal signs.  No indication of appendicitis, bowel obstruction, bowel perforation, cholecystitis, diverticulitis ectopic pregnancy.  AG likely d/t dehydration, improved after second liter of normal saline. Plan to give one more 1L of IVF with BMP recheck. Symptoms are well controlled. Plan to d/c home  with symptomatic control pending any changes. Signed out to Dr. Mora Bellman. Patient d/w with Dr. Hyacinth Meeker, agrees with plan.    Jeannetta Ellis, PA-C 04/28/14 [phone]

## 2014-04-27 NOTE — ED Notes (Signed)
Attempted IV stick, unsuccessful. IV team notified

## 2014-04-27 NOTE — ED Notes (Signed)
Pt given a cup ice water

## 2014-04-27 NOTE — ED Provider Notes (Signed)
33 year old female with approximately one day of acute onset of nausea vomiting and watery diarrhea. There is been approximately 18 episodes of this. She does have a history of Crohn's disease and is on Remicade, she denies any rectal bleeding and states this is distinctly different from her Crohn's disease exacerbations. On exam the patient is a soft mildly diffusely tender abdomen without guarding or peritoneal signs, clear heart and lung sounds and appears to be in no acute distress. IV fluids, symptomatically tall, repeat basic metabolic panel for this. She is mildly acidotic with an anion gap likely related to repeated vomiting and acidosis.  Fluids, meds, recheck  Medical screening examination/treatment/procedure(s) were conducted as a shared visit with non-physician practitioner(s) and myself.  I personally evaluated the patient during the encounter.  Clinical Impression: N/V/D   Vida Roller, MD 04/28/14 [phone]

## 2014-04-27 NOTE — ED Notes (Signed)
The patient has been having nausea, vomiting, and diarrhea.  She says she has not been able to keep anything down and has had 17 instances of diarrhea.  The patient says she has been diagnosed with ulcerative colitis but is in remission.  She says she also has Rheumatoid Arthritis and is taking medications for it.  She is worried that yesterday she licked her finger and it had free on on it.  She also says she has sjorgrens syndrome which causes dry eyes, dry mouth.  She is concerned that with her vomiting she is dehydrated.  She rates her abdominal pain 6/10.

## 2014-04-28 LAB — BASIC METABOLIC PANEL
Anion gap: 12 (ref 5–15)
BUN: 21 mg/dL (ref 6–23)
CO2: 18 mEq/L — ABNORMAL LOW (ref 19–32)
Calcium: 7.7 mg/dL — ABNORMAL LOW (ref 8.4–10.5)
Chloride: 105 mEq/L (ref 96–112)
Creatinine, Ser: 0.81 mg/dL (ref 0.50–1.10)
GFR calc Af Amer: >90 mL/min (ref >90)
GFR calc non Af Amer: >90 mL/min (ref >90)
Glucose, Bld: 115 mg/dL — ABNORMAL HIGH (ref 70–99)
Potassium: 4.1 mEq/L (ref 3.7–5.3)
Sodium: 135 mEq/L — ABNORMAL LOW (ref 137–147)

## 2014-04-28 LAB — URINE MICROSCOPIC-ADD ON

## 2014-04-28 MED ORDER — ONDANSETRON HCL 4 MG/2ML IJ SOLN
4.0000 mg | Freq: Once | INTRAMUSCULAR | Status: AC
  Administered 2014-04-28: 4 mg via INTRAVENOUS
  Filled 2014-04-28: qty 2

## 2014-04-28 MED ORDER — ONDANSETRON 4 MG PO TBDP: 4.0000 mg | ORAL_TABLET | Freq: Three times a day (TID) | ORAL | Status: DC | PRN

## 2014-04-28 NOTE — Discharge Instructions (Signed)
Please follow up with your primary care physician in 1-2 days. If you do not have one please call the [facility] number listed above. Please read all discharge instructions and return precautions.  ° °Viral Gastroenteritis °Viral gastroenteritis is also known as stomach flu. This condition affects the stomach and intestinal tract. It can cause sudden diarrhea and vomiting. The illness typically lasts 3 to 8 days. Most people develop an immune response that eventually gets rid of the virus. While this natural response develops, the virus can make you quite ill. °CAUSES  °Many different viruses can cause gastroenteritis, such as rotavirus or noroviruses. You can catch one of these viruses by consuming contaminated food or water. You may also catch a virus by sharing utensils or other personal items with an infected person or by touching a contaminated surface. °SYMPTOMS  °The most common symptoms are diarrhea and vomiting. These problems can cause a severe loss of body fluids (dehydration) and a body salt (electrolyte) imbalance. Other symptoms may include: °· Fever. °· Headache. °· Fatigue. °· Abdominal pain. °DIAGNOSIS  °Your caregiver can usually diagnose viral gastroenteritis based on your symptoms and a physical exam. A stool sample may also be taken to test for the presence of viruses or other infections. °TREATMENT  °This illness typically goes away on its own. Treatments are aimed at rehydration. The most serious cases of viral gastroenteritis involve vomiting so severely that you are not able to keep fluids down. In these cases, fluids must be given through an intravenous line (IV). °HOME CARE INSTRUCTIONS  °· Drink enough fluids to keep your urine clear or pale yellow. Drink small amounts of fluids frequently and increase the amounts as tolerated. °· Ask your caregiver for specific rehydration instructions. °· Avoid: °¨ Foods high in sugar. °¨ Alcohol. °¨ Carbonated  drinks. °¨ Tobacco. °¨ Juice. °¨ Caffeine drinks. °¨ Extremely hot or cold fluids. °¨ Fatty, greasy foods. °¨ Too much intake of anything at one time. °¨ Dairy products until 24 to 48 hours after diarrhea stops. °· You may consume probiotics. Probiotics are active cultures of beneficial bacteria. They may lessen the amount and number of diarrheal stools in adults. Probiotics can be found in yogurt with active cultures and in supplements. °· Wash your hands well to avoid spreading the virus. °· Only take over-the-counter or prescription medicines for pain, discomfort, or fever as directed by your caregiver. Do not give aspirin to children. Antidiarrheal medicines are not recommended. °· Ask your caregiver if you should continue to take your regular prescribed and over-the-counter medicines. °· Keep all follow-up appointments as directed by your caregiver. °SEEK IMMEDIATE MEDICAL CARE IF:  °· You are unable to keep fluids down. °· You do not urinate at least once every 6 to 8 hours. °· You develop shortness of breath. °· You notice blood in your stool or vomit. This may look like coffee grounds. °· You have abdominal pain that increases or is concentrated in one small area (localized). °· You have persistent vomiting or diarrhea. °· You have a fever. °· The patient is a child younger than 3 months, and he or she has a fever. °· The patient is a child older than 3 months, and he or she has a fever and persistent symptoms. °· The patient is a child older than 3 months, and he or she has a fever and symptoms suddenly get worse. °· The patient is a baby, and he or she has no tears when   crying. °MAKE SURE YOU:  °· Understand these instructions. °· Will watch your condition. °· Will get help right away if you are not doing well or get worse. °Document Released: 08/12/2005 Document Revised: 11/04/2011 Document Reviewed: 05/29/2011 °Exit[employer]® Patient Information ©2015 [employer]. This information is not intended to replace  advice given to you by your health care provider. Make sure you discuss any questions you have with your health care provider. ° °

## 2014-04-28 NOTE — ED Provider Notes (Signed)
Patient signed out to me by Victorino Dike, [place]  Patient had 1 L of IV fluids pending. Repeat labs revealed close anion gap. Patient states she feels much better is denying any pain. She denies any nausea bili lights and Zofran to go home with. Patient saw signs remained stable and she is resting comfortably. She is in no acute distress in the bed. Patient will be discharged safely home with Zofran.  Tomasita Crumble, MD 04/28/14 [phone]

## 2014-04-28 NOTE — ED Provider Notes (Signed)
Medical screening examination/treatment/procedure(s) were conducted as a shared visit with non-physician practitioner(s) and myself.  I personally evaluated the patient during the encounter  Please see my separate respective documentation pertaining to this patient encounter   Nataliya Graig D Harlym Gehling, MD 04/28/14 1426 

## 2014-08-03 ENCOUNTER — Other Ambulatory Visit (HOSPITAL_COMMUNITY): Payer: Self-pay | Admitting: *Deleted

## 2014-08-04 ENCOUNTER — Encounter (HOSPITAL_COMMUNITY)
Admission: RE | Admit: 2014-08-04 | Discharge: 2014-08-04 | Disposition: A | Discharge status: Home / Self Care | Payer: Federal, State, Local not specified - PPO | Source: Ambulatory Visit | Attending: Rheumatology | Admitting: Rheumatology

## 2014-08-04 DIAGNOSIS — Z5181 Encounter for therapeutic drug level monitoring: Secondary | ICD-10-CM | POA: Diagnosis not present

## 2014-08-04 DIAGNOSIS — M069 Rheumatoid arthritis, unspecified: Secondary | ICD-10-CM | POA: Diagnosis present

## 2014-08-04 MED ORDER — SODIUM CHLORIDE 0.9 % IV SOLN
INTRAVENOUS | Status: DC
  Administered 2014-08-04: 250 mL via INTRAVENOUS

## 2014-08-04 MED ORDER — SODIUM CHLORIDE 0.9 % IV SOLN
500.0000 mg | INTRAVENOUS | Status: DC
  Administered 2014-08-04: 500 mg via INTRAVENOUS
  Filled 2014-08-04: qty 20

## 2014-08-17 ENCOUNTER — Encounter (HOSPITAL_COMMUNITY)
Admission: RE | Admit: 2014-08-17 | Discharge: 2014-08-17 | Disposition: A | Discharge status: Home / Self Care | Payer: Federal, State, Local not specified - PPO | Source: Ambulatory Visit | Attending: Rheumatology | Admitting: Rheumatology

## 2014-08-17 DIAGNOSIS — M069 Rheumatoid arthritis, unspecified: Secondary | ICD-10-CM | POA: Diagnosis not present

## 2014-08-17 MED ORDER — SODIUM CHLORIDE 0.9 % IV SOLN
500.0000 mg | INTRAVENOUS | Status: DC
  Administered 2014-08-17: 500 mg via INTRAVENOUS
  Filled 2014-08-17: qty 20

## 2014-08-17 MED ORDER — SODIUM CHLORIDE 0.9 % IV SOLN
INTRAVENOUS | Status: DC
  Administered 2014-08-17: 09:00:00 via INTRAVENOUS

## 2014-09-14 ENCOUNTER — Encounter (HOSPITAL_COMMUNITY)
Admission: RE | Admit: 2014-09-14 | Discharge: 2014-09-14 | Disposition: A | Discharge status: Home / Self Care | Payer: Federal, State, Local not specified - PPO | Source: Ambulatory Visit | Attending: Rheumatology | Admitting: Rheumatology

## 2014-09-14 DIAGNOSIS — Z5181 Encounter for therapeutic drug level monitoring: Secondary | ICD-10-CM | POA: Insufficient documentation

## 2014-09-14 DIAGNOSIS — M069 Rheumatoid arthritis, unspecified: Secondary | ICD-10-CM | POA: Insufficient documentation

## 2014-09-14 MED ORDER — ABATACEPT 250 MG IV SOLR
500.0000 mg | INTRAVENOUS | Status: DC
  Administered 2014-09-14: 500 mg via INTRAVENOUS
  Filled 2014-09-14: qty 20

## 2014-09-14 MED ORDER — SODIUM CHLORIDE 0.9 % IV SOLN
INTRAVENOUS | Status: DC
  Administered 2014-09-14: 09:00:00 via INTRAVENOUS

## 2014-10-11 IMAGING — NM NM HEPATO W/GB/PHARM/[PERSON_NAME]
3 series · 13 of 13 positions shown · non-contrast
Comparison: None

RADIOPHARMACEUTICALS:  5 mCi 4c-66m mebrofenin

CLINICAL DATA: Right upper quadrant pain and nausea for 2 months

EXAM:
NUCLEAR MEDICINE HEPATOBILIARY IMAGING WITH GALLBLADDER EF
TECHNIQUE: Sequential images of the abdomen were obtained [DATE] minutes
following intravenous administration of radiopharmaceutical. Patient
then received 0.95 mcg of CCK IV and imaging was continued for 30
min.

[Series 0: hepatobiliary · 3.20mm/px · 6 of 30 frames shown (1 of 3)]
[frame 3/30]
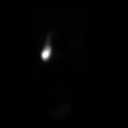
[frame 8/30]
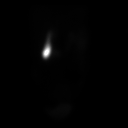
[frame 13/30]
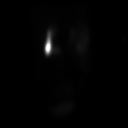
[frame 18/30]
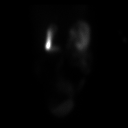
[frame 23/30]
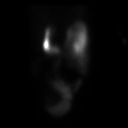
[frame 28/30]
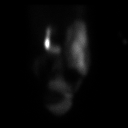

[Series 0: hepatobiliary · 3.20mm/px · 6 of 60 frames shown (2 of 3)]
[frame 6/60]
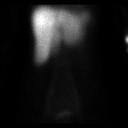
[frame 16/60]
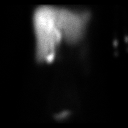
[frame 26/60]
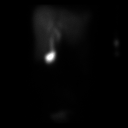
[frame 36/60]
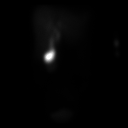
[frame 46/60]
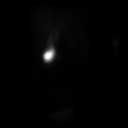
[frame 56/60]
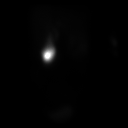

[Series 0: hepatobiliary · 1 of 1 slices shown (3 of 3)]
[im 1/1]
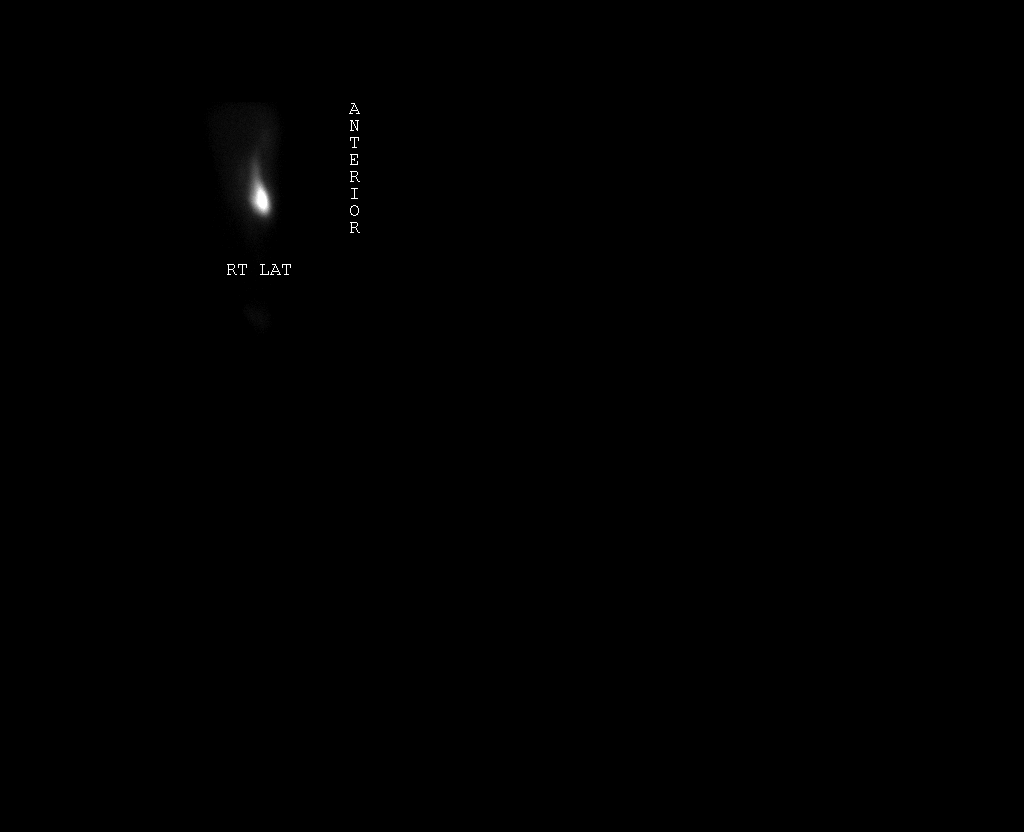

[13 of 13 positions shown; findings below may reference images not displayed]

FINDINGS: Normal tracer extraction from bloodstream indicating normal
hepatocellular function.

Prompt excretion of tracer into the biliary tree.

Gallbladder visualized by 16 min.

Small bowel visualized at 42 min.

No significant hepatic retention of tracer.

Normal emptying of tracer from the gallbladder following CCK
stimulation.

Calculated gallbladder ejection fraction is 81%, normal.

Patient experienced no symptoms following CCK administration.
IMPRESSION: Normal exam.

## 2014-10-11 IMAGING — US US ABDOMEN COMPLETE
1 series · 14 of 25 positions shown · non-contrast
Comparison: CT abdomen pelvis of 05/25/2013

CLINICAL DATA: Abdominal pain

EXAM:
ULTRASOUND ABDOMEN COMPLETE

[Series 1: us abdomen complete · 0.18mm/px · 14 of 69 slices shown]
[im 1/69]
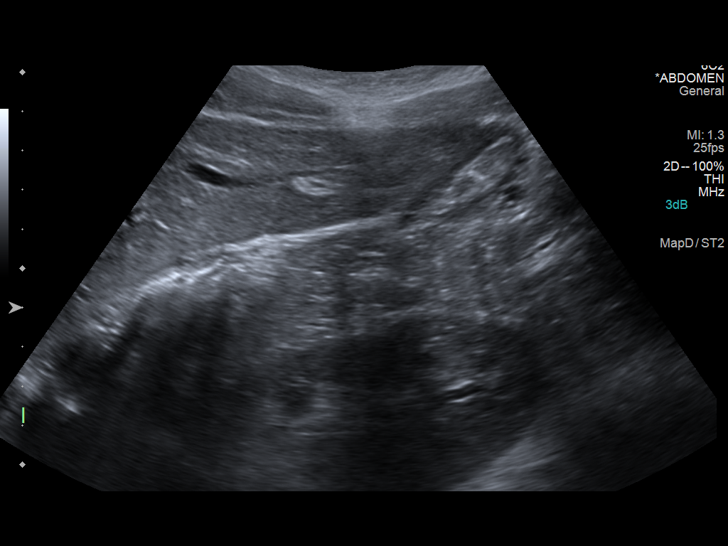
[im 6/69]
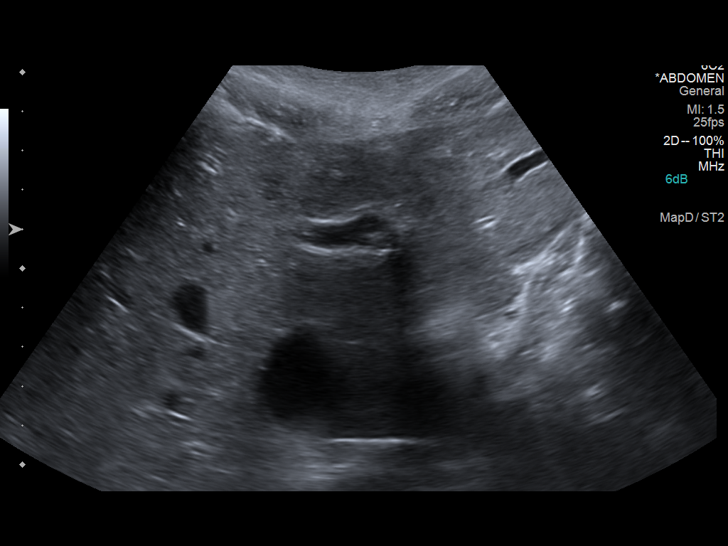
[im 12/69]
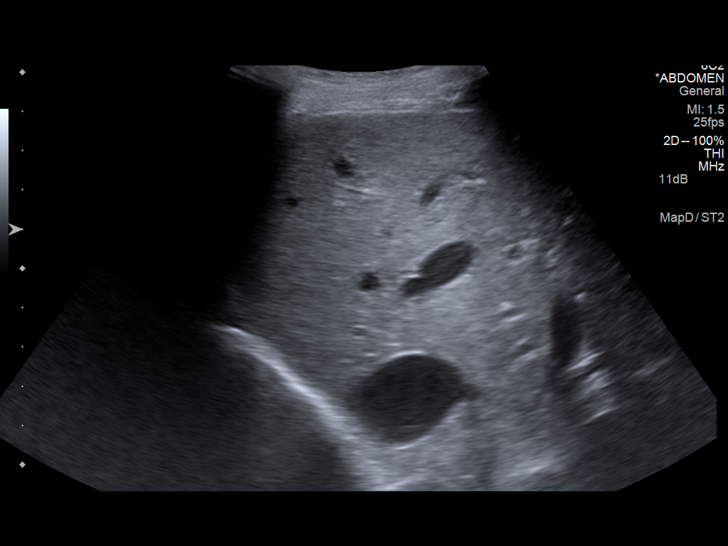
[im 18/69]
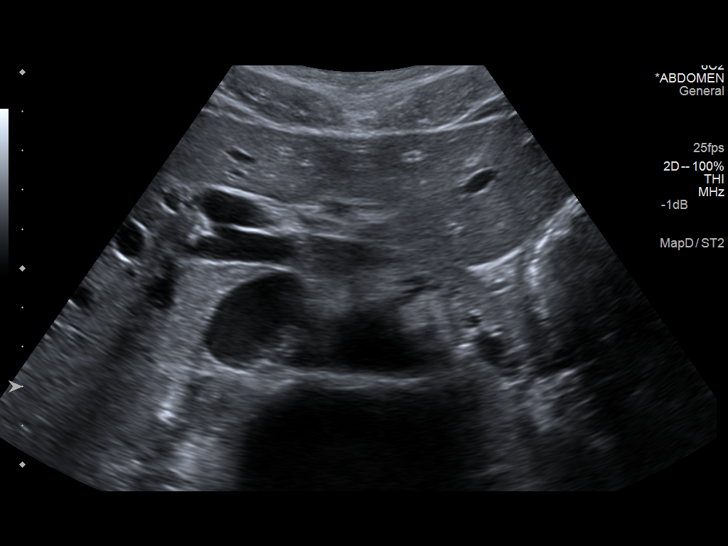
[im 23/69]
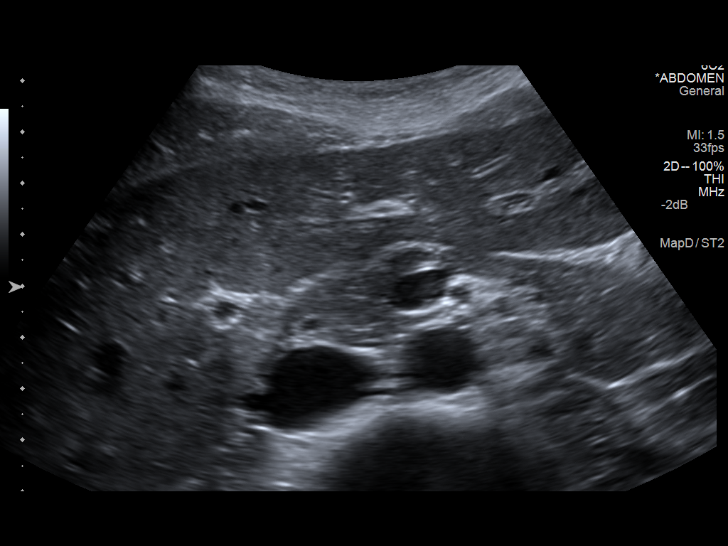
[im 26/69]
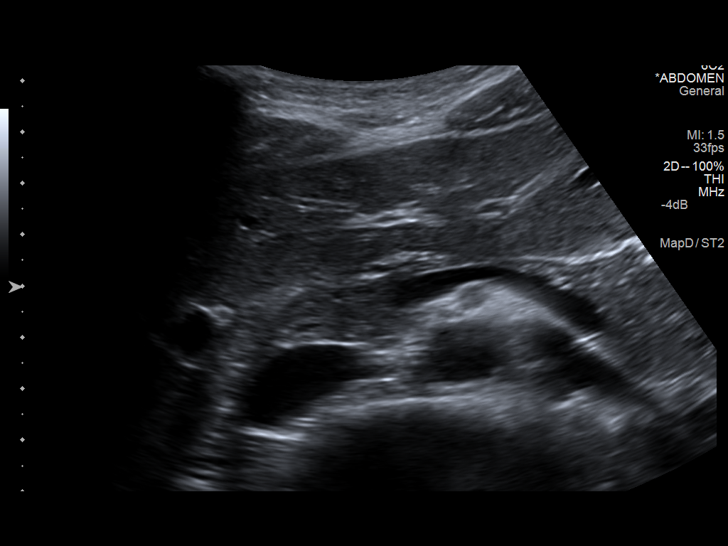
[im 32/69]
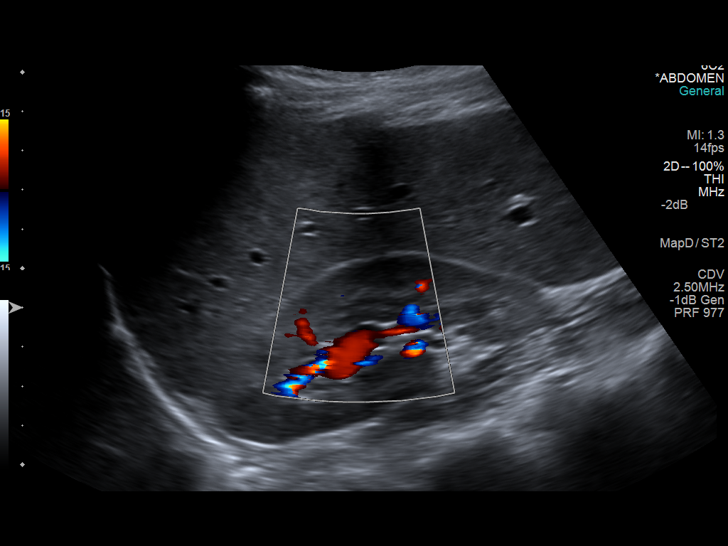
[im 37/69]
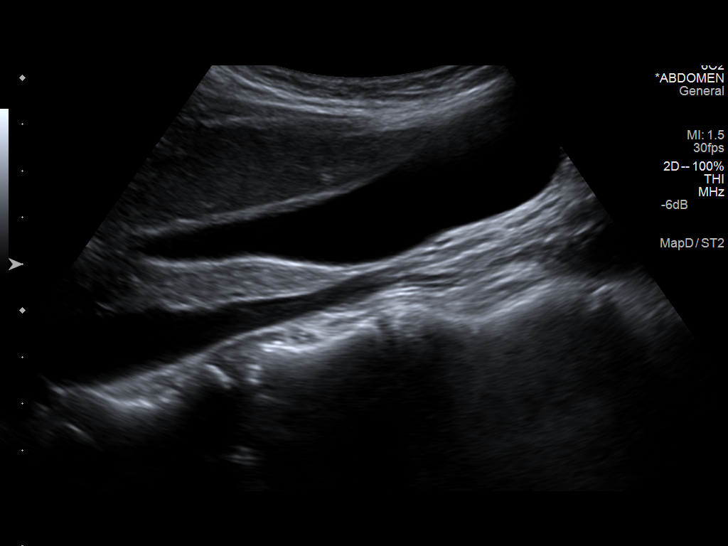
[im 43/69]
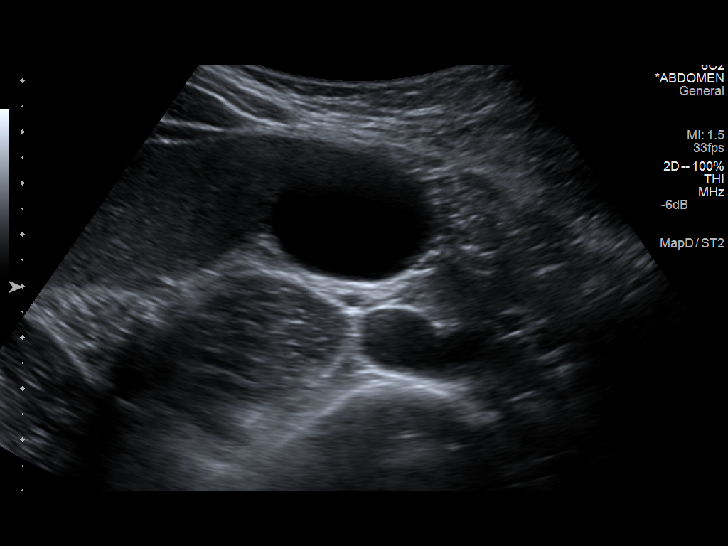
[im 46/69]
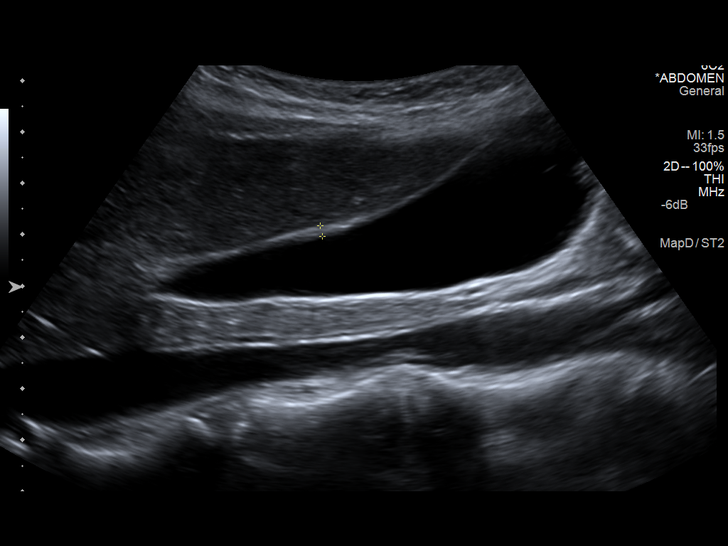
[im 52/69]
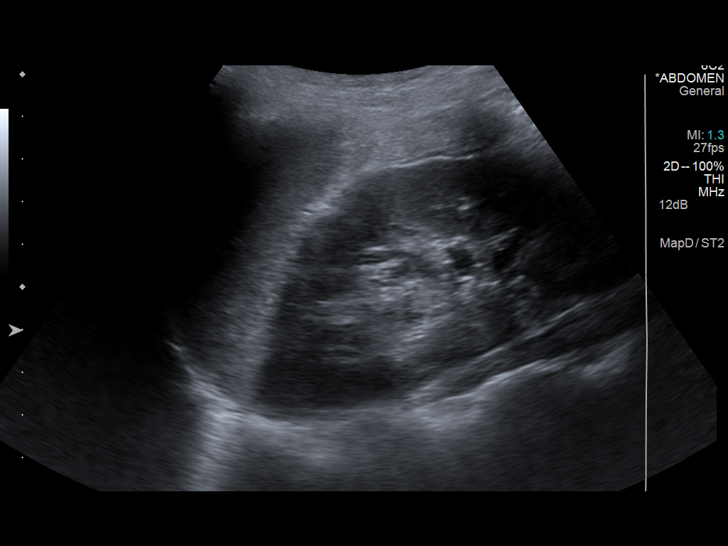
[im 57/69]
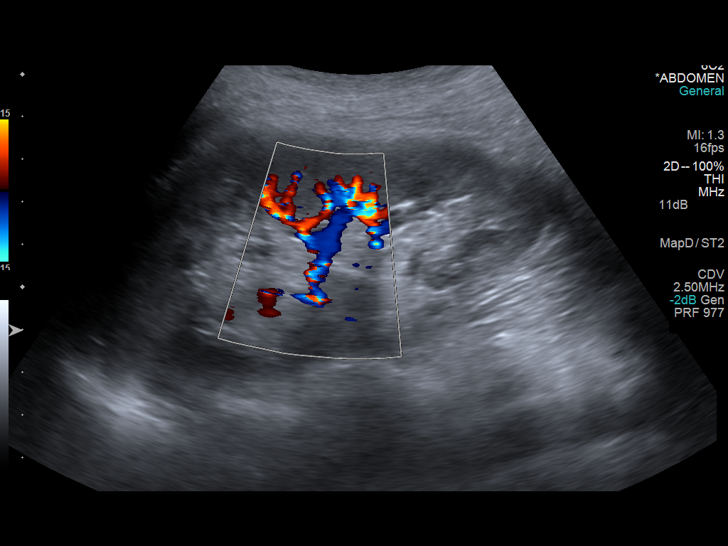
[im 63/69]
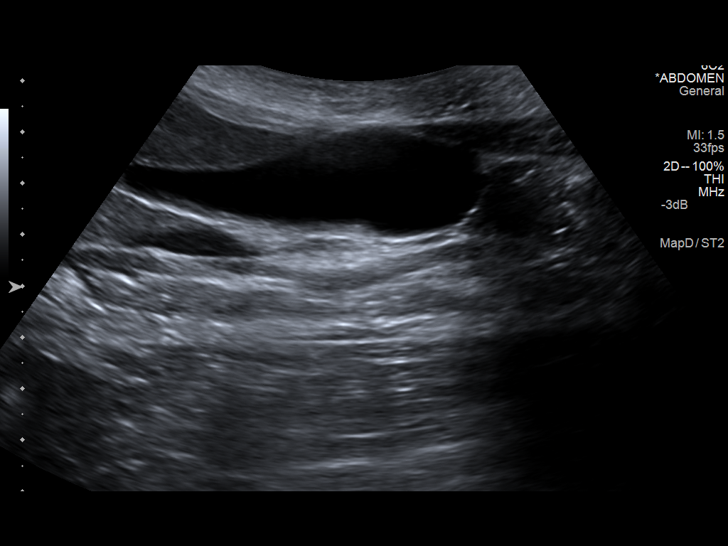
[im 69/69]
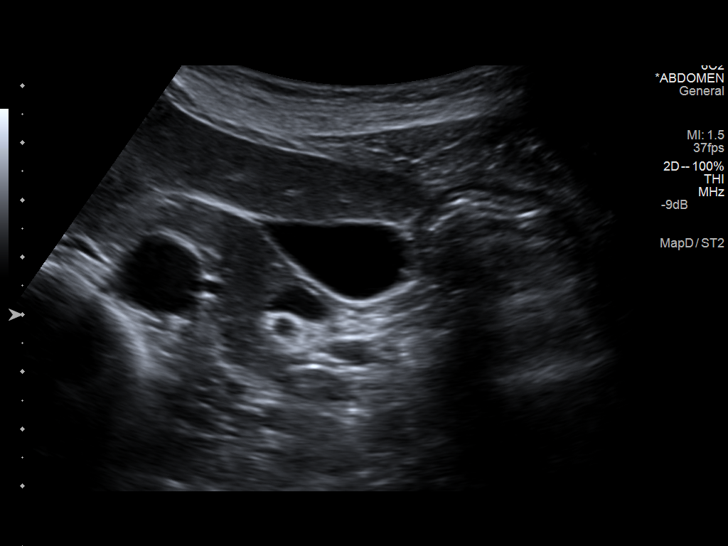

[14 of 25 positions shown; findings below may reference images not displayed]

FINDINGS: Gallbladder

The gallbladder is well visualized and no gallstones are noted.
There is no pain over the gallbladder with compression.

Common bile duct

Diameter: The common bile duct is normal measuring 2.9 mm in
diameter.

Liver

The liver has a normal echogenic pattern. No focal abnormality is
seen.

IVC

No abnormality is visualized.

Pancreas

Visualized portion is unremarkable.

Spleen

The spleen is normal measuring 6.6 cm sagittally.

Right Kidney

Length: No hydronephrosis is seen. The right kidney measures 10.6 cm
sagittally..

Left Kidney

Length: The left kidney measures 11.1 cm. No hydronephrosis is
seen.. .

Abdominal aorta

The abdominal aorta is normal in caliber.
IMPRESSION: Negative abdominal ultrasound.

## 2014-10-12 ENCOUNTER — Encounter (HOSPITAL_COMMUNITY)
Admission: RE | Admit: 2014-10-12 | Discharge: 2014-10-12 | Disposition: A | Discharge status: Home / Self Care | Payer: Federal, State, Local not specified - PPO | Source: Ambulatory Visit | Attending: Rheumatology | Admitting: Rheumatology

## 2014-10-12 DIAGNOSIS — M069 Rheumatoid arthritis, unspecified: Secondary | ICD-10-CM | POA: Insufficient documentation

## 2014-10-12 DIAGNOSIS — Z5181 Encounter for therapeutic drug level monitoring: Secondary | ICD-10-CM | POA: Insufficient documentation

## 2014-10-12 MED ORDER — SODIUM CHLORIDE 0.9 % IV SOLN
INTRAVENOUS | Status: AC
  Administered 2014-10-12: 09:00:00 via INTRAVENOUS

## 2014-10-12 MED ORDER — SODIUM CHLORIDE 0.9 % IV SOLN
500.0000 mg | INTRAVENOUS | Status: AC
  Administered 2014-10-12: 500 mg via INTRAVENOUS
  Filled 2014-10-12: qty 20

## 2014-11-07 ENCOUNTER — Other Ambulatory Visit (HOSPITAL_COMMUNITY): Payer: Self-pay | Admitting: *Deleted

## 2014-11-08 ENCOUNTER — Inpatient Hospital Stay (HOSPITAL_COMMUNITY): Admission: RE | Admit: 2014-11-08 | Payer: Federal, State, Local not specified - PPO | Source: Ambulatory Visit

## 2016-02-13 ENCOUNTER — Other Ambulatory Visit: Payer: Self-pay | Admitting: Obstetrics and Gynecology

## 2016-02-14 LAB — CYTOLOGY - PAP

## 2016-03-19 ENCOUNTER — Other Ambulatory Visit: Payer: Self-pay | Admitting: Obstetrics and Gynecology

## 2016-11-08 ENCOUNTER — Encounter (INDEPENDENT_AMBULATORY_CARE_PROVIDER_SITE_OTHER): Payer: Federal, State, Local not specified - PPO | Admitting: Ophthalmology

## 2016-11-08 DIAGNOSIS — H33303 Unspecified retinal break, bilateral: Secondary | ICD-10-CM | POA: Diagnosis not present

## 2016-11-08 DIAGNOSIS — H35413 Lattice degeneration of retina, bilateral: Secondary | ICD-10-CM | POA: Diagnosis not present

## 2016-11-08 DIAGNOSIS — H43813 Vitreous degeneration, bilateral: Secondary | ICD-10-CM | POA: Diagnosis not present

## 2016-11-15 ENCOUNTER — Encounter (INDEPENDENT_AMBULATORY_CARE_PROVIDER_SITE_OTHER): Payer: Federal, State, Local not specified - PPO | Admitting: Ophthalmology

## 2016-11-15 DIAGNOSIS — H33303 Unspecified retinal break, bilateral: Secondary | ICD-10-CM | POA: Diagnosis not present

## 2016-11-21 ENCOUNTER — Ambulatory Visit (INDEPENDENT_AMBULATORY_CARE_PROVIDER_SITE_OTHER): Payer: Federal, State, Local not specified - PPO | Admitting: Ophthalmology

## 2017-04-25 DIAGNOSIS — M069 Rheumatoid arthritis, unspecified: Secondary | ICD-10-CM | POA: Insufficient documentation

## 2017-11-24 ENCOUNTER — Encounter (HOSPITAL_COMMUNITY): Payer: Self-pay | Admitting: Emergency Medicine

## 2017-11-24 ENCOUNTER — Emergency Department (HOSPITAL_COMMUNITY): Payer: Medicaid Other

## 2017-11-24 ENCOUNTER — Emergency Department (HOSPITAL_COMMUNITY)
Admission: EM | Admit: 2017-11-24 | Discharge: 2017-11-25 | Disposition: A | Discharge status: Home / Self Care | Payer: Medicaid Other | Attending: Emergency Medicine | Admitting: Emergency Medicine

## 2017-11-24 DIAGNOSIS — R509 Fever, unspecified: Secondary | ICD-10-CM | POA: Insufficient documentation

## 2017-11-24 DIAGNOSIS — N8311 Corpus luteum cyst of right ovary: Secondary | ICD-10-CM | POA: Diagnosis not present

## 2017-11-24 DIAGNOSIS — R1031 Right lower quadrant pain: Secondary | ICD-10-CM | POA: Diagnosis not present

## 2017-11-24 DIAGNOSIS — R8290 Unspecified abnormal findings in urine: Secondary | ICD-10-CM | POA: Insufficient documentation

## 2017-11-24 DIAGNOSIS — R109 Unspecified abdominal pain: Secondary | ICD-10-CM | POA: Diagnosis present

## 2017-11-24 DIAGNOSIS — R197 Diarrhea, unspecified: Secondary | ICD-10-CM | POA: Insufficient documentation

## 2017-11-24 DIAGNOSIS — R110 Nausea: Secondary | ICD-10-CM | POA: Insufficient documentation

## 2017-11-24 LAB — URINALYSIS, ROUTINE W REFLEX MICROSCOPIC
Bilirubin Urine: NEGATIVE
Glucose, UA: NEGATIVE mg/dL
Hgb urine dipstick: NEGATIVE
Ketones, ur: NEGATIVE mg/dL
Nitrite: NEGATIVE
Protein, ur: NEGATIVE mg/dL
Specific Gravity, Urine: 1.014 (ref 1.005–1.030)
pH: 6 (ref 5.0–8.0)

## 2017-11-24 LAB — COMPREHENSIVE METABOLIC PANEL
ALT: 22 U/L (ref 14–54)
AST: 26 U/L (ref 15–41)
Albumin: 4.3 g/dL (ref 3.5–5.0)
Alkaline Phosphatase: 61 U/L (ref 38–126)
Anion gap: 8 (ref 5–15)
BUN: 10 mg/dL (ref 6–20)
CO2: 23 mmol/L (ref 22–32)
Calcium: 9.2 mg/dL (ref 8.9–10.3)
Chloride: 104 mmol/L (ref 101–111)
Creatinine, Ser: 0.69 mg/dL (ref 0.44–1.00)
GFR calc Af Amer: >60 mL/min (ref >60)
GFR calc non Af Amer: >60 mL/min (ref >60)
Glucose, Bld: 101 mg/dL — ABNORMAL HIGH (ref 65–99)
Potassium: 4 mmol/L (ref 3.5–5.1)
Sodium: 135 mmol/L (ref 135–145)
Total Bilirubin: 0.5 mg/dL (ref 0.3–1.2)
Total Protein: 8.2 g/dL — ABNORMAL HIGH (ref 6.5–8.1)

## 2017-11-24 LAB — CBC WITH DIFFERENTIAL/PLATELET
Basophils Absolute: 0 10*3/uL (ref 0.0–0.1)
Basophils Relative: 0 %
Eosinophils Absolute: 0 10*3/uL (ref 0.0–0.7)
Eosinophils Relative: 1 %
HCT: 37.7 % (ref 36.0–46.0)
Hemoglobin: 12.3 g/dL (ref 12.0–15.0)
Lymphocytes Relative: 47 %
Lymphs Abs: 2.7 10*3/uL (ref 0.7–4.0)
MCH: 29 pg (ref 26.0–34.0)
MCHC: 32.6 g/dL (ref 30.0–36.0)
MCV: 88.9 fL (ref 78.0–100.0)
Monocytes Absolute: 0.4 10*3/uL (ref 0.1–1.0)
Monocytes Relative: 8 %
Neutro Abs: 2.5 10*3/uL (ref 1.7–7.7)
Neutrophils Relative %: 44 %
Platelets: 257 10*3/uL (ref 150–400)
RBC: 4.24 MIL/uL (ref 3.87–5.11)
RDW: 13.3 % (ref 11.5–15.5)
WBC: 5.6 10*3/uL (ref 4.0–10.5)

## 2017-11-24 LAB — I-STAT BETA HCG BLOOD, ED (MC, WL, AP ONLY): I-stat hCG, quantitative: <5 m[IU]/mL (ref <5)

## 2017-11-24 LAB — LIPASE, BLOOD: Lipase: 33 U/L (ref 11–51)

## 2017-11-24 MED ORDER — IOPAMIDOL (ISOVUE-300) INJECTION 61%
INTRAVENOUS | Status: AC
  Administered 2017-11-24: 100 mL
  Filled 2017-11-24: qty 100

## 2017-11-24 MED ORDER — ONDANSETRON 4 MG PO TBDP: 4.0000 mg | ORAL_TABLET | Freq: Once | ORAL | Status: DC

## 2017-11-24 NOTE — ED Triage Notes (Signed)
Pt c/o RLQ pain x 3 days. Pain worsened today, pt reports some nausea and 1 episode of vomiting. Denies urinary symptoms. Hx ulcerative colitis.

## 2017-11-24 NOTE — ED Provider Notes (Signed)
Patient placed in Quick Look pathway, seen and evaluated   Chief Complaint: RLQ abd pain  HPI:   Pt w PMHx ulcerative colitis, presenting to ED with 3 days of worsening RLQ abd pain. Pain is sharp, worse with movement. Assoc N/V. No change in BM. Denies urinary sx, blood in stool. States this feels different than UC flare.  ROS: + abd pain, + N/V  Physical Exam:   Gen: No distress, appears uncomfortable.  Neuro: Awake and Alert  Skin: Warm    Focused Exam: abdomen is soft, TTP RLQ with some guarding.   CT abd ordered to r/o appendicitis.  Initiation of care has begun. The patient has been counseled on the process, plan, and necessity for staying for the completion/evaluation, and the remainder of the medical screening examination    Denyla Cortese, [place] N, PA-C 11/24/17 2021    Terrilee Files, MD 11/25/17 1217

## 2017-11-25 MED ORDER — CEPHALEXIN 500 MG PO CAPS: 500.0000 mg | ORAL_CAPSULE | Freq: Two times a day (BID) | ORAL | 0 refills | Status: DC

## 2017-11-25 MED ORDER — ONDANSETRON 4 MG PO TBDP: 4.0000 mg | ORAL_TABLET | Freq: Three times a day (TID) | ORAL | 0 refills | Status: DC | PRN

## 2017-11-25 NOTE — ED Provider Notes (Signed)
[facility] EMERGENCY DEPARTMENT Provider Note   CSN: [id number] Arrival date & time: 11/24/17  1907     History   Chief Complaint Chief Complaint  Patient presents with  . Abdominal Pain    HPI Toni Stafford is a 37 y.o. female.  Patient presents to the emergency department with a chief complaint of abdominal pain.  She reports having pain in her right lower quadrant for the past 2 days.  She also reports mild nausea and low-grade fever at home.  She states that she has chronic diarrhea, and has ulcerative colitis.  She denies any new changes in her bowel movements.  She denies any dysuria.  Denies any vaginal discharge or bleeding.  She denies any injuries.  Her symptoms are worsened with palpation.  She denies any other associated symptoms.  The history is provided by the patient. No language interpreter was used.    Past Medical History:  Diagnosis Date  . Arthritis   . Ulcerative colitis     There are no active problems to display for this patient.   Past Surgical History:  Procedure Laterality Date  . KNEE SURGERY       OB History   None      Home Medications    Prior to Admission medications   Medication Sig Start Date End Date Taking? Authorizing Provider  acetaminophen (TYLENOL) 500 MG tablet Take 1,000 mg by mouth every 6 (six) hours as needed for moderate pain.   Yes [provider]  levonorgestrel (MIRENA, 52 MG,) 20 MCG/24HR IUD 1 each by Intrauterine route once.   Yes [provider]  Multiple Vitamin (MULTIVITAMIN WITH MINERALS) TABS tablet Take 1 tablet by mouth daily. 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 11/25/17   Roxy Horseman, PA-C  ondansetron (ZOFRAN ODT) 4 MG disintegrating tablet Take 1  tablet (4 mg total) by mouth every 8 (eight) hours as needed for nausea or vomiting. 11/25/17   Roxy Horseman, PA-C    Family History No family history on file.  Social History Social History   Tobacco Use  . Smoking status: Never Smoker  . Smokeless tobacco: Never Used  Substance Use Topics  . Alcohol use: Yes  . Drug use: Never     Allergies   Olive oil   Review of Systems Review of Systems  All other systems reviewed and are negative.    Physical Exam Updated Vital Signs BP 125/86   Pulse (!) 39   Temp 98.6 F (37 C) (Oral)   Resp 16   SpO2 96%   Physical Exam  Constitutional: She is oriented to person, place, and time. She appears well-developed and well-nourished.  HENT:  Head: Normocephalic and atraumatic.  Eyes: Pupils are equal, round, and reactive to light. Conjunctivae and EOM are normal.  Neck: Normal range of motion. Neck supple.  Cardiovascular: Normal rate and regular rhythm. Exam reveals no gallop and no friction rub.  No murmur heard. Pulmonary/Chest: Effort normal and breath sounds normal. No respiratory distress. She has no wheezes. She has no rales. She exhibits no tenderness.  Abdominal: Soft. Bowel sounds are normal. She exhibits no distension and no mass. There is tenderness in the right lower quadrant. There is no rebound and  no guarding.  Musculoskeletal: Normal range of motion. She exhibits no edema or tenderness.  Neurological: She is alert and oriented to person, place, and time.  Skin: Skin is warm and dry.  Psychiatric: She has a normal mood and affect. Her behavior is normal. Judgment and thought content normal.  Nursing note and vitals reviewed.    ED Treatments / Results  Labs (all labs ordered are listed, but only abnormal results are displayed) Labs Reviewed  COMPREHENSIVE METABOLIC PANEL - Abnormal; Notable for the following components:      Result Value   Glucose, Bld 101 (*)    Total Protein 8.2 (*)    All other  components within normal limits  URINALYSIS, ROUTINE W REFLEX MICROSCOPIC - Abnormal; Notable for the following components:   Leukocytes, UA LARGE (*)    Bacteria, UA RARE (*)    Squamous Epithelial / LPF 0-5 (*)    All other components within normal limits  LIPASE, BLOOD  CBC WITH DIFFERENTIAL/PLATELET  I-STAT BETA HCG BLOOD, ED ([facility], [facility], [facility] ONLY)    EKG None  Radiology Ct Abdomen Pelvis W Contrast  Result Date: 11/24/2017 CLINICAL DATA:  37 year old female with abdominal pain and vomiting. Concern for acute appendicitis. EXAM: CT ABDOMEN AND PELVIS WITH CONTRAST TECHNIQUE: Multidetector CT imaging of the abdomen and pelvis was performed using the standard protocol following bolus administration of intravenous contrast. CONTRAST:  <See Chart> ISOVUE-300 IOPAMIDOL (ISOVUE-300) INJECTION 61% COMPARISON:  CT of the abdomen pelvis dated 05/25/2013 FINDINGS: Lower chest: The visualized lung bases are clear. No intra-abdominal free air. Trace free fluid may be present within the pelvis, likely physiologic. Hepatobiliary: No focal liver abnormality is seen. No gallstones, gallbladder wall thickening, or biliary dilatation. Pancreas: Unremarkable. No pancreatic ductal dilatation or surrounding inflammatory changes. Spleen: Normal in size without focal abnormality. Adrenals/Urinary Tract: Adrenal glands are unremarkable. Kidneys are normal, without renal calculi, focal lesion, or hydronephrosis. Bladder is unremarkable. Stomach/Bowel: Nondistended fluid-filled loops of small bowel in the lower abdomen and pelvis likely physiologic. Clinical correlation is recommended to exclude enteritis. There is no bowel obstruction or active inflammation. The appendix is not visualized with certainty. No inflammatory changes identified in the right lower quadrant. A poorly visualized structure in the right hemipelvis extending from the base of the cecum may represent a normal appendix (series 6 images 35-50).  Vascular/Lymphatic: No significant vascular findings are present. No enlarged abdominal or pelvic lymph nodes. Reproductive: The uterus is retroflexed. An intrauterine device is noted. The ovaries are poorly visualized. There is probably a 1.5 cm corpus luteum in the right ovary. Other: None Musculoskeletal: No acute or significant osseous findings. IMPRESSION: 1. Normal caliber fluid-filled distal small bowel loops may be physiologic or represent enteritis. Clinical correlation is recommended. No bowel obstruction. The appendix is not visualized with certainty. No definite evidence of inflammatory changes in the right lower quadrant or pelvis. 2. A 1.5 cm right ovarian corpus luteum. Electronically Signed   By: Elgie Collard M.D.   On: 11/24/2017 22:21    Procedures Procedures (including critical care time)  Medications Ordered in ED Medications  ondansetron (ZOFRAN-ODT) disintegrating tablet 4 mg (has no administration in time range)  iopamidol (ISOVUE-300) 61 % injection (100 mLs  Contrast Given 11/24/17 2152)     Initial Impression / Assessment and Plan / ED Course  I have reviewed the triage vital signs and the nursing notes.  Pertinent labs & imaging results that were available during my care of the patient were reviewed  by me and considered in my medical decision making (see chart for details).     Patient with right lower quadrant tenderness times 2 days.  CT scan shows no inflammatory changes around the appendix, however the appendix is not specifically identified.  She does not have a leukocytosis, her vital signs are normal.  She is afebrile.  There is a corpus luteum in the right adnexa, which could be contributing to some of her discomfort, she also has an abnormal urinalysis and could be developing a urinary tract infection which could be the source of her symptoms as well.  Will cover with Keflex.  Recommend close return precautions.  Patient understands and agrees the plan.  She  is stable and ready for discharge.  Final Clinical Impressions(s) / ED Diagnoses   Final diagnoses:  Corpus luteum cyst of right ovary  Abnormal urinalysis    ED Discharge Orders        Ordered    cephALEXin (KEFLEX) 500 MG capsule  2 times daily     11/25/17 0119    ondansetron (ZOFRAN ODT) 4 MG disintegrating tablet  Every 8 hours PRN     11/25/17 0119       Roxy Horseman, PA-C 11/25/17 0128    Dione Booze, MD 11/25/17 [phone]

## 2017-11-25 NOTE — ED Notes (Addendum)
Pt came up to desk requesting to have her IV taken out. Informed pt that she was the longest wait and that I had 3 people going home and that she was next to go back to a room. Pt stated "it won't matter at this point I've been sitting out here 5 hours and I'm ready to go home." Pt requested that IV be taken out. This tech took out the IV and informed pt that she would be taken out of the system. Pt then asked this tech how long it would be if she waited I informed pt yet again that as soon as the pt's in the back went home I would move her back and go and clean her room myself. Pt stated "I'll wait 10 more minutes." Pt went and sat down to wait

## 2018-05-18 ENCOUNTER — Ambulatory Visit (HOSPITAL_COMMUNITY)
Admission: EM | Admit: 2018-05-18 | Discharge: 2018-05-18 | Disposition: A | Discharge status: Home / Self Care | Payer: Medicaid Other | Attending: Family Medicine | Admitting: Family Medicine

## 2018-05-18 ENCOUNTER — Ambulatory Visit (INDEPENDENT_AMBULATORY_CARE_PROVIDER_SITE_OTHER): Payer: Medicaid Other

## 2018-05-18 ENCOUNTER — Encounter (HOSPITAL_COMMUNITY): Payer: Self-pay | Admitting: Emergency Medicine

## 2018-05-18 DIAGNOSIS — R079 Chest pain, unspecified: Secondary | ICD-10-CM | POA: Diagnosis not present

## 2018-05-18 DIAGNOSIS — M25512 Pain in left shoulder: Secondary | ICD-10-CM

## 2018-05-18 DIAGNOSIS — R05 Cough: Secondary | ICD-10-CM | POA: Diagnosis not present

## 2018-05-18 MED ORDER — KETOROLAC TROMETHAMINE 30 MG/ML IJ SOLN
INTRAMUSCULAR | Status: AC
  Filled 2018-05-18: qty 1

## 2018-05-18 MED ORDER — NAPROXEN 500 MG PO TABS: 500.0000 mg | ORAL_TABLET | Freq: Two times a day (BID) | ORAL | 0 refills | Status: AC | PRN

## 2018-05-18 MED ORDER — KETOROLAC TROMETHAMINE 30 MG/ML IJ SOLN
30.0000 mg | Freq: Once | INTRAMUSCULAR | Status: AC
  Administered 2018-05-18: 30 mg via INTRAMUSCULAR

## 2018-05-18 NOTE — Discharge Instructions (Addendum)
It was nice seeing you today. I am sorry about the rib pain. I am also sorry that the pain injection itself is painful. Please use Naproxen as needed for pain. Your rib xray is neg for fracture, this just be a sprain. Let us give it time to heal. See [place] soon if you have any question.
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
## 2018-05-18 NOTE — ED Provider Notes (Signed)
[facility]    CSN: [id number] Arrival date & time: 05/18/18  1708     History   Chief Complaint Chief Complaint  Patient presents with  . Rib Injury    HPI Toni Stafford is a 37 y.o. female.   The history is provided by the patient. No language interpreter was used.  Chest Pain  Pain location:  R lateral chest and R chest Pain quality: stabbing   Pain radiates to:  Does not radiate Pain severity:  Moderate (8/10 in severity) Onset quality: Standing on the vender of her truck while trying to change the oil filter and then she tripped over the bumber area hitting the right side of her rib anteriorly. Duration:  4 days Timing:  Constant Progression:  Worsening Chronicity:  New Context: breathing, lifting, movement and trauma   Context comment:  Started coughing today and cough aggravates it too Relieved by:  Nothing Worsened by:  Coughing Ineffective treatments: NSAID/Ibuprofen 200mg  x 3 and hitting pad. Associated symptoms: cough and nausea   Associated symptoms: no abdominal pain, no fever, no headache, no shortness of breath and no vomiting   Associated symptoms comment:  Felt dizzy at work today but not now   Past Medical History:  Diagnosis Date  . Arthritis   . Ulcerative colitis     There are no active problems to display for this patient.   Past Surgical History:  Procedure Laterality Date  . KNEE SURGERY      OB History   None      Home Medications    Prior to Admission medications   Medication Sig Start Date End Date Taking? Authorizing Provider  acetaminophen (TYLENOL) 500 MG tablet Take 1,000 mg by mouth every 6 (six) hours as needed for moderate pain.    [provider]  cephALEXin (KEFLEX) 500 MG capsule Take 1 capsule (500 mg total) by mouth 2 (two) times daily. Patient not taking: Reported on 05/18/2018 11/25/17   01/25/18, PA-C  levonorgestrel (MIRENA, 52 MG,) 20 MCG/24HR IUD 1 each by Intrauterine route  once.    [provider]  Multiple Vitamin (MULTIVITAMIN WITH MINERALS) TABS tablet Take 1 tablet by mouth daily. Liquid 1 table spoon a day    [provider]  ondansetron (ZOFRAN ODT) 4 MG disintegrating tablet Take 1 tablet (4 mg total) by mouth every 8 (eight) hours as needed for nausea or vomiting. Patient not taking: Reported on 05/18/2018 11/25/17   01/25/18, PA-C  OVER THE COUNTER MEDICATION Take 1 capsule by mouth daily. Ganoderma    [provider]    Family History No family history on file.  Social History Social History   Tobacco Use  . Smoking status: Never Smoker  . Smokeless tobacco: Never Used  Substance Use Topics  . Alcohol use: Yes  . Drug use: Never     Allergies   Olive oil   Review of Systems Review of Systems  Constitutional: Negative for fever.  Respiratory: Positive for cough. Negative for shortness of breath.   Cardiovascular: Positive for chest pain.  Gastrointestinal: Positive for nausea. Negative for abdominal pain and vomiting.  Neurological: Negative for headaches.  All other systems reviewed and are negative.    Physical Exam Triage Vital Signs ED Triage Vitals [05/18/18 1736]  Enc Vitals Group     BP 132/72     Pulse Rate 68     Resp 16     Temp 98.1 F (36.7 C)  Temp src      SpO2 100 %     Weight      Height      Head Circumference      Peak Flow      Pain Score      Pain Loc      Pain Edu?      Excl. in GC?    No data found.  Updated Vital Signs BP 132/72   Pulse 68   Temp 98.1 F (36.7 C)   Resp 16   SpO2 100%   Visual Acuity Right Eye Distance:   Left Eye Distance:   Bilateral Distance:    Right Eye Near:   Left Eye Near:    Bilateral Near:     Physical Exam  Constitutional: She appears well-developed. No distress.  Cardiovascular: Normal rate, regular rhythm and normal heart sounds.  No murmur heard. Pulmonary/Chest: Effort normal and breath sounds normal. No  respiratory distress. She has no wheezes. She exhibits tenderness.    Abdominal: Soft. Bowel sounds are normal. She exhibits no distension and no mass. There is no tenderness. There is no guarding.  Nursing note and vitals reviewed.    UC Treatments / Results  Labs (all labs ordered are listed, but only abnormal results are displayed) Labs Reviewed - No data to display  EKG None  Radiology No results found.  Procedures Procedures (including critical care time)  Medications Ordered in UC Medications - No data to display  Initial Impression / Assessment and Plan / UC Course  I have reviewed the triage vital signs and the nursing notes.  Pertinent labs & imaging results that were available during my care of the patient were reviewed by me and considered in my medical decision making (see chart for details).  Clinical Course as of May 18 1950  Mon May 18, 2018  1949 Chest xray neg for rib fracture. ?? Sprain. Toradol injection given her. Naproxen prn pain. F/U soon if no improvement.   [KE]  1949 Left shoulder pain s/p Toradol injection for chest pain during this visit. I reexamined her shoulder, no swelling, no erythema, no loss of function of her left UL. Ice pack applied. Naproxen prn pain. Monitor for improvement.   [KE]    Clinical Course User Index [KE] Doreene Eland, MD    Chest pain, unspecified type  Acute pain of left shoulder   Final Clinical Impressions(s) / UC Diagnoses   Final diagnoses:  None   Discharge Instructions   None    ED Prescriptions    None     Controlled Substance Prescriptions [place] Controlled Substance Registry consulted? Not Applicable   Doreene Eland, MD 05/18/18 [phone]

## 2018-06-10 ENCOUNTER — Encounter (HOSPITAL_COMMUNITY): Payer: Self-pay | Admitting: Emergency Medicine

## 2018-06-10 ENCOUNTER — Ambulatory Visit (HOSPITAL_COMMUNITY)
Admission: EM | Admit: 2018-06-10 | Discharge: 2018-06-10 | Disposition: A | Discharge status: Home / Self Care | Payer: Medicaid Other | Attending: Family Medicine | Admitting: Family Medicine

## 2018-06-10 DIAGNOSIS — B9789 Other viral agents as the cause of diseases classified elsewhere: Secondary | ICD-10-CM | POA: Diagnosis not present

## 2018-06-10 DIAGNOSIS — J329 Chronic sinusitis, unspecified: Secondary | ICD-10-CM | POA: Diagnosis not present

## 2018-06-10 MED ORDER — CETIRIZINE HCL 10 MG PO TABS: 10.0000 mg | ORAL_TABLET | Freq: Every day | ORAL | 0 refills | Status: DC

## 2018-06-10 MED ORDER — FLUTICASONE PROPIONATE 50 MCG/ACT NA SUSP: 1.0000 | Freq: Every day | NASAL | 2 refills | Status: DC

## 2018-06-10 NOTE — ED Triage Notes (Signed)
Pt here for facial pain, states she had a headache yesterday and bilateral ear pressure. Pt states her face hurts today.

## 2018-06-10 NOTE — ED Provider Notes (Signed)
[facility]    CSN: [id number] Arrival date & time: 06/10/18  1021     History   Chief Complaint Chief Complaint  Patient presents with  . Facial Pain    HPI Toni Stafford is a 37 y.o. female.   Patient is a 37 year old female presents with 1 week of worsening sinus pressure, runny nose, ear pressure. Chills and slight cough.Clear drainage from nose. Worse when she bends over and she can feel it in her teeth.  She used affrin this past weekend with some relief. She only used this once.  She took Advil last week for headache with relief.  She denies any associated fever, body aches, sweats, fatigue, purulent drainage.   ROS per HPI      Past Medical History:  Diagnosis Date  . Arthritis   . Ulcerative colitis     There are no active problems to display for this patient.   Past Surgical History:  Procedure Laterality Date  . KNEE SURGERY      OB History   None      Home Medications    Prior to Admission medications   Medication Sig Start Date End Date Taking? Authorizing Provider  acetaminophen (TYLENOL) 500 MG tablet Take 1,000 mg by mouth every 6 (six) hours as needed for moderate pain.    [provider]  cephALEXin (KEFLEX) 500 MG capsule Take 1 capsule (500 mg total) by mouth 2 (two) times daily. Patient not taking: Reported on 05/18/2018 11/25/17   Roxy Horseman, PA-C  cetirizine (ZYRTEC) 10 MG tablet Take 1 tablet (10 mg total) by mouth daily. 06/10/18   Aquilla Shambley, Gloris Manchester A, NP  fluticasone (FLONASE) 50 MCG/ACT nasal spray Place 1 spray into both nostrils daily. 06/10/18   Dahlia Byes A, NP  levonorgestrel (MIRENA, 52 MG,) 20 MCG/24HR IUD 1 each by Intrauterine route once.    [provider]  Multiple Vitamin (MULTIVITAMIN WITH MINERALS) TABS tablet Take 1 tablet by mouth daily. Liquid 1 table spoon a day    [provider]  ondansetron (ZOFRAN ODT) 4 MG disintegrating tablet Take 1 tablet (4 mg total) by mouth every 8  (eight) hours as needed for nausea or vomiting. Patient not taking: Reported on 05/18/2018 11/25/17   Roxy Horseman, PA-C  OVER THE COUNTER MEDICATION Take 1 capsule by mouth daily. Ganoderma    [provider]    Family History No family history on file.  Social History Social History   Tobacco Use  . Smoking status: Never Smoker  . Smokeless tobacco: Never Used  Substance Use Topics  . Alcohol use: Yes  . Drug use: Never     Allergies   Olive oil   Review of Systems Review of Systems   Physical Exam Triage Vital Signs ED Triage Vitals  Enc Vitals Group     BP 06/10/18 1106 111/73     Pulse Rate 06/10/18 1106 69     Resp 06/10/18 1106 16     Temp 06/10/18 1106 98.6 F (37 C)     Temp Source 06/10/18 1106 Oral     SpO2 06/10/18 1106 100 %     Weight --      Height --      Head Circumference --      Peak Flow --      Pain Score 06/10/18 1107 4     Pain Loc --      Pain Edu? --      Excl. in  GC? --    No data found.  Updated Vital Signs BP 111/73 (BP Location: Left Arm)   Pulse 69   Temp 98.6 F (37 C) (Oral)   Resp 16   SpO2 100%   Visual Acuity Right Eye Distance:   Left Eye Distance:   Bilateral Distance:    Right Eye Near:   Left Eye Near:    Bilateral Near:     Physical Exam  Constitutional: She is oriented to person, place, and time. She appears well-developed and well-nourished.  Very pleasant. Non toxic or ill appearing.   HENT:  Head: Normocephalic and atraumatic.  Bilateral TMs normal.  External ears normal.  Without posterior oropharyngeal erythema, tonsillar swelling or exudates. No lesions.  Clear drainage from nares.  No lymphadenopathy.  Tenderness to frontal and maxillary sinuses Moderate bilateral nasal turbinate swelling.   Eyes: Conjunctivae are normal.  Neck: Normal range of motion.  Cardiovascular: Normal rate, regular rhythm and normal heart sounds.  Pulmonary/Chest: Effort normal and breath sounds normal.    Lungs clear in all fields. No dyspnea or distress. No retractions or nasal flaring.   Musculoskeletal: Normal range of motion.  Neurological: She is oriented to person, place, and time.  Skin: Skin is warm.  Psychiatric: She has a normal mood and affect.  Nursing note and vitals reviewed.    UC Treatments / Results  Labs (all labs ordered are listed, but only abnormal results are displayed) Labs Reviewed - No data to display  EKG None  Radiology No results found.  Procedures Procedures (including critical care time)  Medications Ordered in UC Medications - No data to display  Initial Impression / Assessment and Plan / UC Course  I have reviewed the triage vital signs and the nursing notes.  Pertinent labs & imaging results that were available during my care of the patient were reviewed by me and considered in my medical decision making (see chart for details).     Viral sinusitis- will treat with Flonase and zyrtec Instructed to follow up in the next 3 to 4 days if her symptoms continue.  Final Clinical Impressions(s) / UC Diagnoses   Final diagnoses:  Viral sinusitis     Discharge Instructions     I believe your symptoms are related to a viral sinusitis. We will treat this with Flonase nasal spray and Zyrtec If you are not having any relief of symptoms in the next couple of days let us know or sooner for worsening symptoms.    ED Prescriptions    Medication Sig Dispense Auth. Provider   fluticasone (FLONASE) 50 MCG/ACT nasal spray Place 1 spray into both nostrils daily. 16 g Taleya Whitcher A, NP   cetirizine (ZYRTEC) 10 MG tablet Take 1 tablet (10 mg total) by mouth daily. 30 tablet Dahlia Byes A, NP     Controlled Substance Prescriptions [place] Controlled Substance Registry consulted? Not Applicable   Janace Aris, NP 06/10/18 1206

## 2018-06-10 NOTE — Discharge Instructions (Addendum)
I believe your symptoms are related to a viral sinusitis. We will treat this with Flonase nasal spray and Zyrtec If you are not having any relief of symptoms in the next couple of days let us know or sooner for worsening symptoms.

## 2019-09-04 IMAGING — DX DG RIBS W/ CHEST 3+V*R*
3 series · 3 of 3 positions shown · non-contrast
Comparison: Chest x-ray dated 12/30/2011.

CLINICAL DATA: Fall, injury to RIGHT ribs.

EXAM:
RIGHT RIBS AND CHEST - 3+ VIEW

[chest pa]
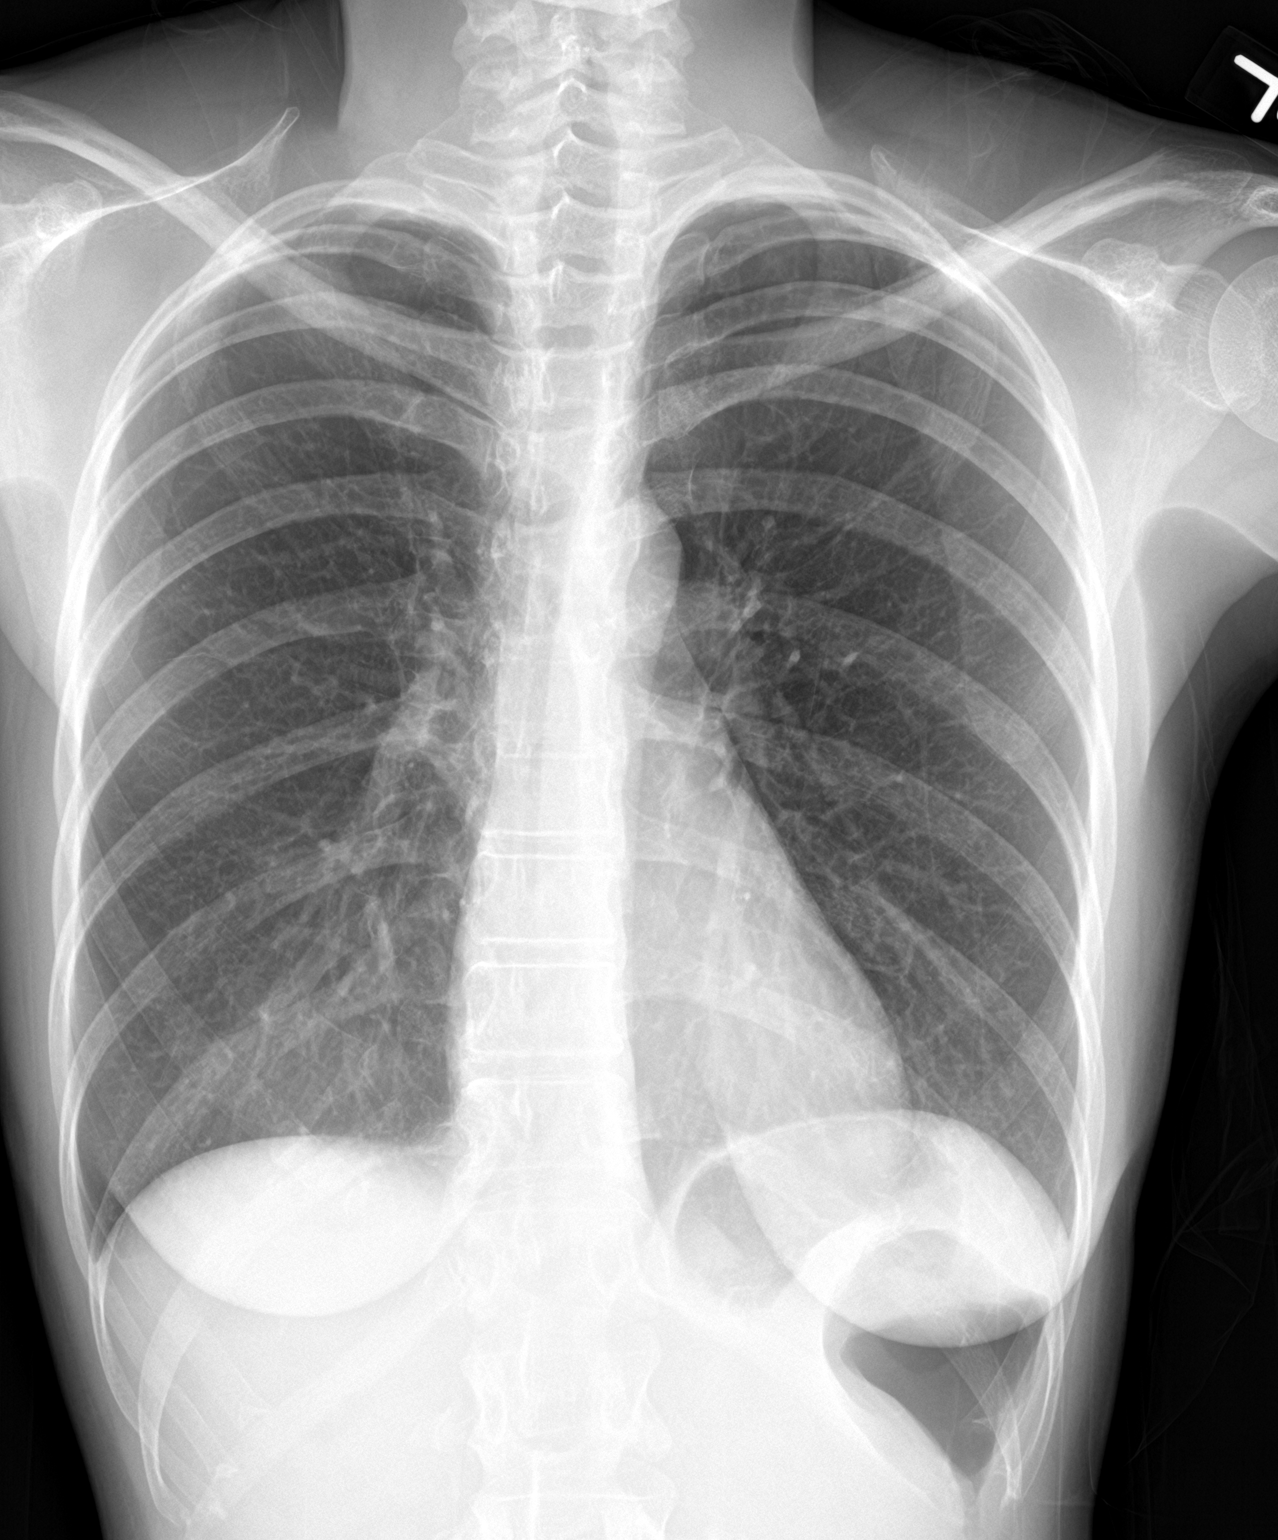

[rib pa]
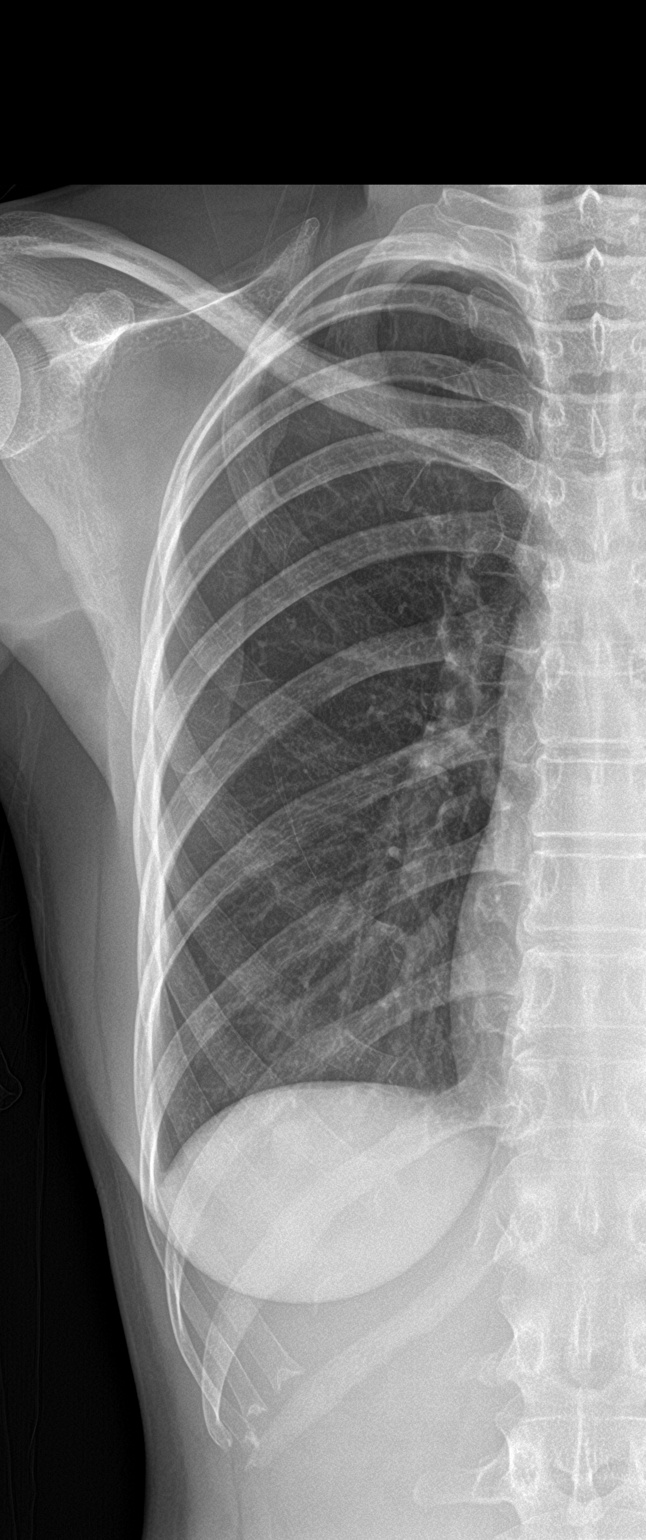

[rib obl]
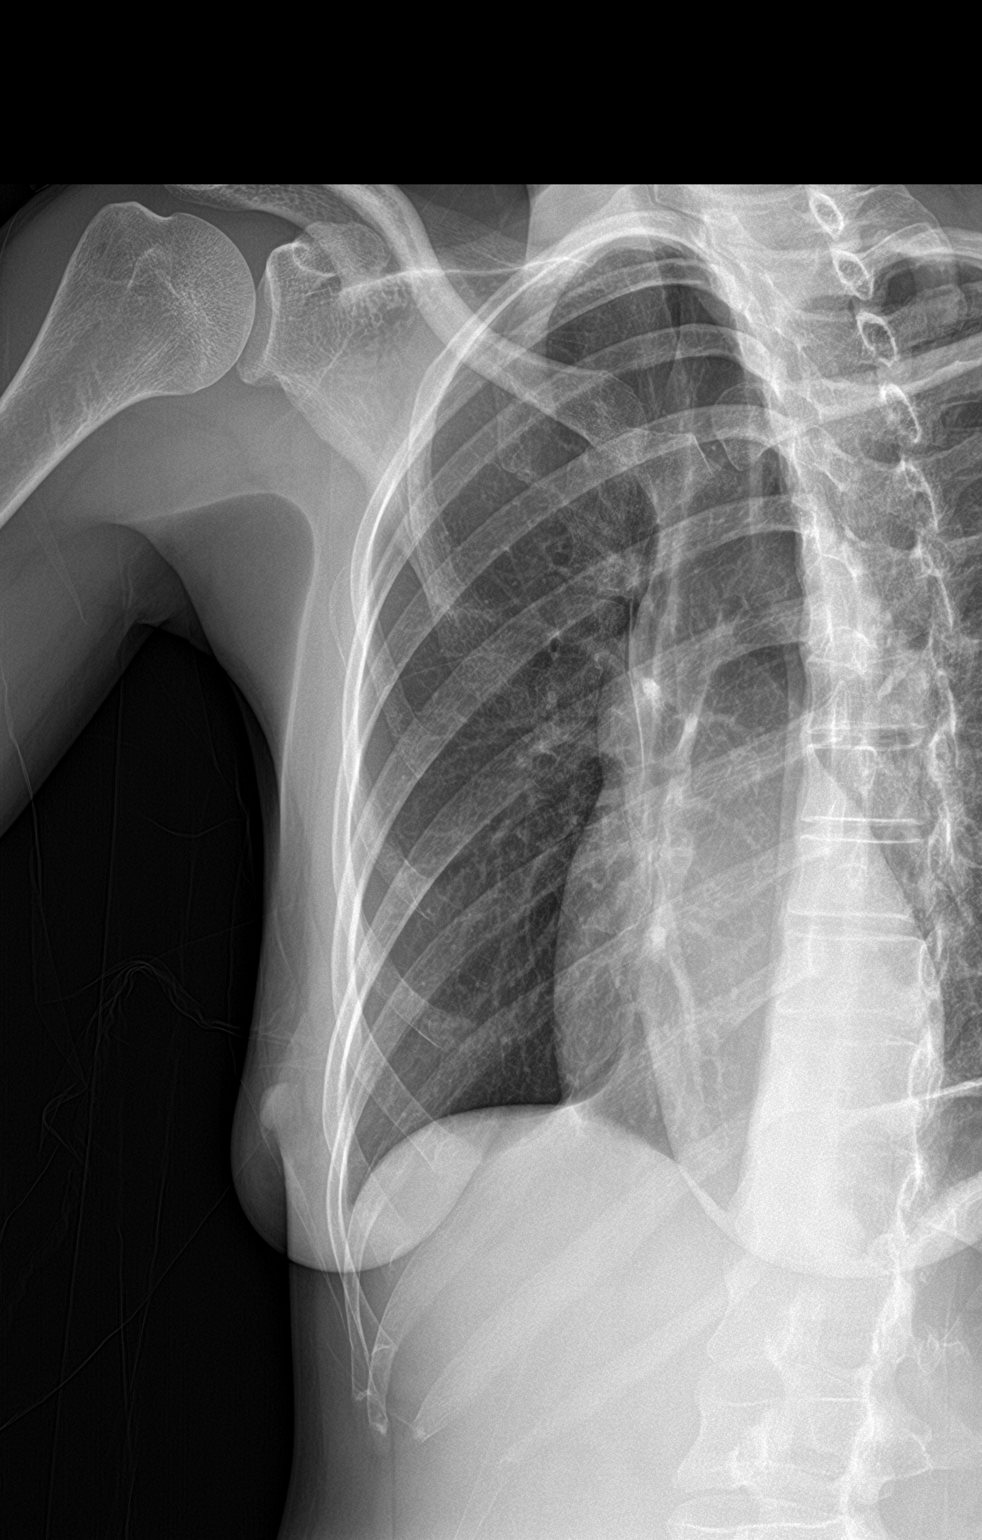

[3 of 3 positions shown; findings below may reference images not displayed]

FINDINGS: Single-view of the chest and two views of the RIGHT ribs are
provided.

Heart size and mediastinal contours are within normal limits. Lungs
are clear. No pleural effusion or pneumothorax seen. Osseous
structures about the chest are unremarkable. No rib fracture or
dislocation seen.
IMPRESSION: Negative.

## 2019-12-01 LAB — HM PAP SMEAR

## 2019-12-01 LAB — RESULTS CONSOLE HPV: CHL HPV: NEGATIVE

## 2020-02-24 DIAGNOSIS — Z419 Encounter for procedure for purposes other than remedying health state, unspecified: Secondary | ICD-10-CM | POA: Diagnosis not present

## 2020-03-07 ENCOUNTER — Ambulatory Visit: Payer: Self-pay | Admitting: General Surgery

## 2020-03-07 NOTE — H&P (Signed)
The patient is a 39 year old female who presents with a complaint of Rectal bleeding. 39 year old female who presents to the office for evaluation of rectal bleeding. She states that she has been diagnosed with ulcerative colitis in the past but is not on any medications for this currently. She describes prolapsing hemorrhoids for many years. Over the past few months, they have worsened and started to bleed every day. She denies any constipation or straining. She is on a high-fiber vegetarian diet. She is not anemic. She has never had any hemorrhoid surgeries in the past. Colonoscopy performed 2 months ago shows no sign of ulcerative colitis, off treatment and prolapsing grade 3 hemorrhoids.   Past Surgical History Micheal Likens, CMA; 03/07/2020 11:38 AM) Oral Surgery  Diagnostic Studies History (Chanel Lonni Fix, [place]; 03/07/2020 11:38 AM) Colonoscopy within last year Mammogram within last year Pap Smear 1-5 years ago  Allergies Hedy Camara Lonni Fix, CMA; 03/07/2020 11:38 AM) No Known Drug Allergies [03/07/2020]: Allergies Reconciled  Medication History (Chanel Lonni Fix, CMA; 03/07/2020 11:38 AM) No Current Medications Medications Reconciled  Social History (Chanel Lonni Fix, CMA; 03/07/2020 11:38 AM) Alcohol use Occasional alcohol use. Caffeine use Tea. No drug use Tobacco use Never smoker.  Family History Micheal Likens, CMA; 03/07/2020 11:38 AM) Alcohol Abuse Father. Hypertension Mother. Migraine Headache Brother.  Pregnancy / Birth History Micheal Likens, CMA; 03/07/2020 11:38 AM) Age at menarche 10 years. Contraceptive History Intrauterine device. Gravida 3 Length (months) of breastfeeding 7-12 Maternal age 34-20 Para 3  Other Problems (Chanel Lonni Fix, CMA; 03/07/2020 11:38 AM) Hemorrhoids     Review of Systems (Chanel Nolan CMA; 03/07/2020 11:38 AM) General Not Present- Appetite Loss, Chills, Fatigue, Fever, Night Sweats, Weight Gain and Weight Loss. Skin Not  Present- Change in Wart/Mole, Dryness, Hives, Jaundice, New Lesions, Non-Healing Wounds, Rash and Ulcer. HEENT Not Present- Earache, Hearing Loss, Hoarseness, Nose Bleed, Oral Ulcers, Ringing in the Ears, Seasonal Allergies, Sinus Pain, Sore Throat, Visual Disturbances, Wears glasses/contact lenses and Yellow Eyes. Respiratory Not Present- Bloody sputum, Chronic Cough, Difficulty Breathing, Snoring and Wheezing. Breast Not Present- Breast Mass, Breast Pain, Nipple Discharge and Skin Changes. Cardiovascular Not Present- Chest Pain, Difficulty Breathing Lying Down, Leg Cramps, Palpitations, Rapid Heart Rate, Shortness of Breath and Swelling of Extremities. Gastrointestinal Present- Hemorrhoids. Not Present- Abdominal Pain, Bloating, Bloody Stool, Change in Bowel Habits, Chronic diarrhea, Constipation, Difficulty Swallowing, Excessive gas, Gets full quickly at meals, Indigestion, Nausea, Rectal Pain and Vomiting. Female Genitourinary Not Present- Frequency, Nocturia, Painful Urination, Pelvic Pain and Urgency. Musculoskeletal Not Present- Back Pain, Joint Pain, Joint Stiffness, Muscle Pain, Muscle Weakness and Swelling of Extremities. Neurological Not Present- Decreased Memory, Fainting, Headaches, Numbness, Seizures, Tingling, Tremor, Trouble walking and Weakness. Psychiatric Not Present- Anxiety, Bipolar, Change in Sleep Pattern, Depression, Fearful and Frequent crying. Endocrine Not Present- Cold Intolerance, Excessive Hunger, Hair Changes, Heat Intolerance, Hot flashes and New Diabetes. Hematology Not Present- Blood Thinners, Easy Bruising, Excessive bleeding, Gland problems, HIV and Persistent Infections.  Vitals (Chanel Nolan CMA; 03/07/2020 11:38 AM) 03/07/2020 11:38 AM Weight: 115.38 lb Height: 66in Body Surface Area: 1.58 m Body Mass Index: 18.62 kg/m  Temp.: 97.40F  Pulse: 108 (Regular)         Physical Exam Romie Levee MD; 03/07/2020 11:59 AM)  General Mental  Status-Alert. General Appearance-Cooperative.  Rectal Anorectal Exam External - normal sphincter . Internal - normal internal exam.   Results Romie Levee MD; 03/07/2020 12:05 PM) Procedures  Name Value Date ANOSCOPY, DIAGNOSTIC (78295) [ Hemorrhoids ] Procedure Other: Procedure: Anoscopy....Marland KitchenMarland KitchenSurgeon: Maisie Fus....Marland KitchenMarland KitchenAfter  the risks and benefits were explained, verbal consent was obtained for above procedure. A medical assistant chaperone was present thoroughout the entire procedure. ....Marland KitchenMarland KitchenAnesthesia: none....Marland KitchenMarland KitchenDiagnosis: anal bleeding....Marland KitchenMarland KitchenFindings: Grade 3 right posterior internal hemorrhoid, grade 2, right anterior, left lateral internal hemorrhoids without signs of inflammation. Good sphincter tone.  Performed: 03/07/2020 12:04 PM    Assessment & Plan Romie Levee MD; 03/07/2020 12:04 PM)  PROLAPSED INTERNAL HEMORRHOIDS, GRADE 3 (Z61.0) Impression: 39 year old female who presents to the office for evaluation of grade 3 hemorrhoids. Patient was diagnosed with ulcerative colitis in the past and have multiple bowel movements a day. She has recovered from this and has no further signs of inflammatory bowel disease on this recent colonoscopy. She is having one bowel movement a day, which is unformed. On examination today, she has a grade 3 right posterior internal hemorrhoid that appears to be the culprit of most of her bleeding and drainage. She has grade 2 hemorrhoids in the right anterior and left lateral positions that appear noninflamed. We discussed performing trans-hemorrhoidal suture pexy versus hemorrhoidectomy of the right posterior hemorrhoid and possible hemorrhoidal pexy of the left lateral, right anterior hemorrhoids. She has elected to proceed with the latter. We discussed the typical risk of postoperative pain, bleeding and recurrence. All questions were answered.

## 2020-03-26 DIAGNOSIS — Z419 Encounter for procedure for purposes other than remedying health state, unspecified: Secondary | ICD-10-CM | POA: Diagnosis not present

## 2020-04-26 DIAGNOSIS — Z419 Encounter for procedure for purposes other than remedying health state, unspecified: Secondary | ICD-10-CM | POA: Diagnosis not present

## 2020-05-05 ENCOUNTER — Other Ambulatory Visit: Payer: Self-pay

## 2020-05-05 ENCOUNTER — Encounter (HOSPITAL_BASED_OUTPATIENT_CLINIC_OR_DEPARTMENT_OTHER): Payer: Self-pay | Admitting: General Surgery

## 2020-05-05 NOTE — Progress Notes (Signed)
Spoke w/ via phone for pre-op interview---pt  Lab needs dos---- urine poct              Lab results------none COVID test ------05-08-2020 at 1200 Arrive at -------630 am 05-11-2020 NPO after MN NO Solid Food.  Clear liquids from MN until---530 am then npo Medications to take morning of surgery -----none Diabetic medication -----n/a Patient Special Instructions -----none Pre-Op special Istructions -----none Patient verbalized understanding of instructions that were given at this phone interview. Patient denies shortness of breath, chest pain, fever, cough at this phone interview.

## 2020-05-08 ENCOUNTER — Other Ambulatory Visit (HOSPITAL_COMMUNITY)
Admission: RE | Admit: 2020-05-08 | Discharge: 2020-05-08 | Disposition: A | Discharge status: Home / Self Care | Payer: Managed Care, Other (non HMO) | Source: Ambulatory Visit | Attending: General Surgery | Admitting: General Surgery

## 2020-05-08 DIAGNOSIS — Z20822 Contact with and (suspected) exposure to covid-19: Secondary | ICD-10-CM | POA: Diagnosis not present

## 2020-05-08 DIAGNOSIS — Z01812 Encounter for preprocedural laboratory examination: Secondary | ICD-10-CM | POA: Diagnosis present

## 2020-05-09 LAB — SARS CORONAVIRUS 2 (TAT 6-24 HRS): SARS Coronavirus 2: NEGATIVE

## 2020-05-11 ENCOUNTER — Ambulatory Visit (HOSPITAL_BASED_OUTPATIENT_CLINIC_OR_DEPARTMENT_OTHER): Payer: Managed Care, Other (non HMO) | Admitting: Anesthesiology

## 2020-05-11 ENCOUNTER — Other Ambulatory Visit: Payer: Self-pay

## 2020-05-11 ENCOUNTER — Ambulatory Visit (HOSPITAL_BASED_OUTPATIENT_CLINIC_OR_DEPARTMENT_OTHER)
Admission: RE | Admit: 2020-05-11 | Discharge: 2020-05-11 | Disposition: A | Discharge status: Home / Self Care | Payer: Managed Care, Other (non HMO) | Attending: General Surgery | Admitting: General Surgery

## 2020-05-11 ENCOUNTER — Encounter (HOSPITAL_BASED_OUTPATIENT_CLINIC_OR_DEPARTMENT_OTHER)
Admission: RE | Disposition: A | Discharge status: Home / Self Care | Payer: Self-pay | Source: Home / Self Care | Attending: General Surgery

## 2020-05-11 ENCOUNTER — Encounter (HOSPITAL_BASED_OUTPATIENT_CLINIC_OR_DEPARTMENT_OTHER): Payer: Self-pay | Admitting: General Surgery

## 2020-05-11 DIAGNOSIS — M1990 Unspecified osteoarthritis, unspecified site: Secondary | ICD-10-CM | POA: Diagnosis not present

## 2020-05-11 DIAGNOSIS — K642 Third degree hemorrhoids: Secondary | ICD-10-CM | POA: Diagnosis present

## 2020-05-11 HISTORY — PX: HEMORRHOID SURGERY: SHX153

## 2020-05-11 HISTORY — DX: Other complications of anesthesia, initial encounter: T88.59XA

## 2020-05-11 HISTORY — DX: Unspecified hemorrhoids: K64.9

## 2020-05-11 LAB — POCT PREGNANCY, URINE: Preg Test, Ur: NEGATIVE

## 2020-05-11 SURGERY — HEMORRHOIDECTOMY
Anesthesia: Monitor Anesthesia Care

## 2020-05-11 MED ORDER — PROPOFOL 500 MG/50ML IV EMUL
INTRAVENOUS | Status: AC
  Filled 2020-05-11: qty 50

## 2020-05-11 MED ORDER — BUPIVACAINE-EPINEPHRINE 0.5% -1:200000 IJ SOLN
INTRAMUSCULAR | Status: DC | PRN
  Administered 2020-05-11: 30 mL

## 2020-05-11 MED ORDER — ACETAMINOPHEN 500 MG PO TABS
1000.0000 mg | ORAL_TABLET | ORAL | Status: AC
  Administered 2020-05-11: 1000 mg via ORAL

## 2020-05-11 MED ORDER — GABAPENTIN 300 MG PO CAPS
ORAL_CAPSULE | ORAL | Status: AC
  Filled 2020-05-11: qty 1

## 2020-05-11 MED ORDER — LIDOCAINE 5 % EX OINT
TOPICAL_OINTMENT | CUTANEOUS | Status: DC | PRN
  Administered 2020-05-11: 1

## 2020-05-11 MED ORDER — ACETAMINOPHEN 500 MG PO TABS
ORAL_TABLET | ORAL | Status: AC
  Filled 2020-05-11: qty 1

## 2020-05-11 MED ORDER — OXYCODONE HCL 5 MG PO TABS: 5.0000 mg | ORAL_TABLET | ORAL | Status: DC | PRN

## 2020-05-11 MED ORDER — PROPOFOL 500 MG/50ML IV EMUL
INTRAVENOUS | Status: DC | PRN
  Administered 2020-05-11: 200 ug/kg/min via INTRAVENOUS

## 2020-05-11 MED ORDER — ACETAMINOPHEN 325 MG PO TABS: 650.0000 mg | ORAL_TABLET | ORAL | Status: DC | PRN

## 2020-05-11 MED ORDER — BUPIVACAINE LIPOSOME 1.3 % IJ SUSP: 20.0000 mL | Freq: Once | INTRAMUSCULAR | Status: DC

## 2020-05-11 MED ORDER — OXYCODONE HCL 5 MG PO TABS: 5.0000 mg | ORAL_TABLET | ORAL | 0 refills | Status: DC | PRN

## 2020-05-11 MED ORDER — SODIUM CHLORIDE 0.9% FLUSH: 3.0000 mL | Freq: Two times a day (BID) | INTRAVENOUS | Status: DC

## 2020-05-11 MED ORDER — SODIUM CHLORIDE 0.9% FLUSH: 3.0000 mL | INTRAVENOUS | Status: DC | PRN

## 2020-05-11 MED ORDER — LACTATED RINGERS IV SOLN: INTRAVENOUS | Status: DC

## 2020-05-11 MED ORDER — DEXMEDETOMIDINE (PRECEDEX) IN NS 20 MCG/5ML (4 MCG/ML) IV SYRINGE
PREFILLED_SYRINGE | INTRAVENOUS | Status: AC
  Filled 2020-05-11: qty 5

## 2020-05-11 MED ORDER — ACETAMINOPHEN 500 MG PO TABS
ORAL_TABLET | ORAL | Status: AC
  Filled 2020-05-11: qty 2

## 2020-05-11 MED ORDER — MIDAZOLAM HCL 2 MG/2ML IJ SOLN
INTRAMUSCULAR | Status: AC
  Filled 2020-05-11: qty 2

## 2020-05-11 MED ORDER — DEXMEDETOMIDINE (PRECEDEX) IN NS 20 MCG/5ML (4 MCG/ML) IV SYRINGE
PREFILLED_SYRINGE | INTRAVENOUS | Status: DC | PRN
  Administered 2020-05-11 (×3): 4 ug via INTRAVENOUS

## 2020-05-11 MED ORDER — LIDOCAINE 2% (20 MG/ML) 5 ML SYRINGE
INTRAMUSCULAR | Status: AC
  Filled 2020-05-11: qty 5

## 2020-05-11 MED ORDER — MIDAZOLAM HCL 2 MG/2ML IJ SOLN
INTRAMUSCULAR | Status: DC | PRN
  Administered 2020-05-11: 1 mg via INTRAVENOUS

## 2020-05-11 MED ORDER — LIDOCAINE 2% (20 MG/ML) 5 ML SYRINGE
INTRAMUSCULAR | Status: DC | PRN
  Administered 2020-05-11: 40 mg via INTRAVENOUS

## 2020-05-11 MED ORDER — ACETAMINOPHEN 325 MG RE SUPP: 650.0000 mg | RECTAL | Status: DC | PRN

## 2020-05-11 MED ORDER — PROPOFOL 10 MG/ML IV BOLUS
INTRAVENOUS | Status: DC | PRN
  Administered 2020-05-11: 20 mg via INTRAVENOUS
  Administered 2020-05-11: 30 mg via INTRAVENOUS

## 2020-05-11 MED ORDER — BUPIVACAINE LIPOSOME 1.3 % IJ SUSP
INTRAMUSCULAR | Status: DC | PRN
  Administered 2020-05-11: 20 mL

## 2020-05-11 MED ORDER — DIAZEPAM 5 MG PO TABS: 5.0000 mg | ORAL_TABLET | Freq: Three times a day (TID) | ORAL | 0 refills | Status: DC | PRN

## 2020-05-11 MED ORDER — GABAPENTIN 300 MG PO CAPS
300.0000 mg | ORAL_CAPSULE | ORAL | Status: AC
  Administered 2020-05-11: 300 mg via ORAL

## 2020-05-11 MED ORDER — SODIUM CHLORIDE 0.9 % IV SOLN: 250.0000 mL | INTRAVENOUS | Status: DC | PRN

## 2020-05-11 SURGICAL SUPPLY — 36 items
BLADE EXTENDED COATED 6.5IN (ELECTRODE) IMPLANT
BLADE HEX COATED 2.75 (ELECTRODE) ×2 IMPLANT
BLADE SURG 10 STRL SS (BLADE) IMPLANT
BRIEF STRETCH FOR OB PAD LRG (UNDERPADS AND DIAPERS) ×2 IMPLANT
COVER BACK TABLE 60X90IN (DRAPES) ×2 IMPLANT
COVER MAYO STAND STRL (DRAPES) ×2 IMPLANT
COVER WAND RF STERILE (DRAPES) ×2 IMPLANT
DRAPE LAPAROTOMY 100X72 PEDS (DRAPES) ×2 IMPLANT
DRAPE UTILITY XL STRL (DRAPES) ×2 IMPLANT
DRSG PAD ABDOMINAL 8X10 ST (GAUZE/BANDAGES/DRESSINGS) ×2 IMPLANT
ELECT REM PT RETURN 9FT ADLT (ELECTROSURGICAL) ×2
ELECTRODE REM PT RTRN 9FT ADLT (ELECTROSURGICAL) ×1 IMPLANT
GAUZE SPONGE 4X4 12PLY STRL (GAUZE/BANDAGES/DRESSINGS) ×2 IMPLANT
GLOVE BIO SURGEON STRL SZ 6.5 (GLOVE) ×2 IMPLANT
GLOVE BIOGEL PI IND STRL 7.0 (GLOVE) ×1 IMPLANT
GLOVE BIOGEL PI INDICATOR 7.0 (GLOVE) ×1
KIT SIGMOIDOSCOPE (SET/KITS/TRAYS/PACK) IMPLANT
KIT TURNOVER CYSTO (KITS) ×2 IMPLANT
NEEDLE HYPO 22GX1.5 SAFETY (NEEDLE) ×2 IMPLANT
NS IRRIG 500ML POUR BTL (IV SOLUTION) ×2 IMPLANT
PACK BASIN DAY SURGERY FS (CUSTOM PROCEDURE TRAY) ×2 IMPLANT
PAD ARMBOARD 7.5X6 YLW CONV (MISCELLANEOUS) IMPLANT
PENCIL SMOKE EVACUATOR (MISCELLANEOUS) ×2 IMPLANT
SPONGE SURGIFOAM ABS GEL 100 (HEMOSTASIS) IMPLANT
SPONGE SURGIFOAM ABS GEL 12-7 (HEMOSTASIS) IMPLANT
SUT CHROMIC 2 0 SH (SUTURE) ×2 IMPLANT
SUT CHROMIC 3 0 SH 27 (SUTURE) ×4 IMPLANT
SUT VIC AB 2-0 SH 27 (SUTURE)
SUT VIC AB 2-0 SH 27XBRD (SUTURE) IMPLANT
SUT VIC AB 4-0 SH 18 (SUTURE) IMPLANT
SYR CONTROL 10ML LL (SYRINGE) ×2 IMPLANT
TOWEL OR 17X26 10 PK STRL BLUE (TOWEL DISPOSABLE) ×2 IMPLANT
TRAY DSU PREP LF (CUSTOM PROCEDURE TRAY) ×2 IMPLANT
TUBE CONNECTING 12X1/4 (SUCTIONS) ×2 IMPLANT
WATER STERILE IRR 500ML POUR (IV SOLUTION) IMPLANT
YANKAUER SUCT BULB TIP NO VENT (SUCTIONS) ×2 IMPLANT

## 2020-05-11 NOTE — H&P (Signed)
The patient is a 39 year old female who presents with a complaint of Rectal bleeding. 39 year old female who presents to the office for evaluation of rectal bleeding. She states that she has been diagnosed with ulcerative colitis in the past but is not on any medications for this currently. She describes prolapsing hemorrhoids for many years. Over the past few months, they have worsened and started to bleed every day. She denies any constipation or straining. She is on a high-fiber vegetarian diet. She is not anemic. She has never had any hemorrhoid surgeries in the past. Colonoscopy performed 2 months ago shows no sign of ulcerative colitis, off treatment and prolapsing grade 3 hemorrhoids.   Past Surgical History Micheal Likens, CMA; 03/07/2020 11:38 AM) Oral Surgery  Diagnostic Studies History (Chanel Lonni Fix, [place]; 03/07/2020 11:38 AM) Colonoscopy within last year Mammogram within last year Pap Smear 1-5 years ago  Allergies Hedy Camara Lonni Fix, CMA; 03/07/2020 11:38 AM) No Known Drug Allergies [03/07/2020]: Allergies Reconciled  Medication History (Chanel Lonni Fix, CMA; 03/07/2020 11:38 AM) No Current Medications Medications Reconciled  Social History (Chanel Lonni Fix, CMA; 03/07/2020 11:38 AM) Alcohol use Occasional alcohol use. Caffeine use Tea. No drug use Tobacco use Never smoker.  Family History Micheal Likens, CMA; 03/07/2020 11:38 AM) Alcohol Abuse Father. Hypertension Mother. Migraine Headache Brother.  Pregnancy / Birth History Micheal Likens, CMA; 03/07/2020 11:38 AM) Age at menarche 10 years. Contraceptive History Intrauterine device. Gravida 3 Length (months) of breastfeeding 7-12 Maternal age 55-20 Para 3  Other Problems (Chanel Lonni Fix, CMA; 03/07/2020 11:38 AM) Hemorrhoids     Review of Systems  General Not Present- Appetite Loss, Chills, Fatigue, Fever, Night Sweats, Weight Gain and Weight Loss. Skin Not Present- Change in Wart/Mole,  Dryness, Hives, Jaundice, New Lesions, Non-Healing Wounds, Rash and Ulcer. HEENT Not Present- Earache, Hearing Loss, Hoarseness, Nose Bleed, Oral Ulcers, Ringing in the Ears, Seasonal Allergies, Sinus Pain, Sore Throat, Visual Disturbances, Wears glasses/contact lenses and Yellow Eyes. Respiratory Not Present- Bloody sputum, Chronic Cough, Difficulty Breathing, Snoring and Wheezing. Breast Not Present- Breast Mass, Breast Pain, Nipple Discharge and Skin Changes. Cardiovascular Not Present- Chest Pain, Difficulty Breathing Lying Down, Leg Cramps, Palpitations, Rapid Heart Rate, Shortness of Breath and Swelling of Extremities. Gastrointestinal Present- Hemorrhoids. Not Present- Abdominal Pain, Bloating, Bloody Stool, Change in Bowel Habits, Chronic diarrhea, Constipation, Difficulty Swallowing, Excessive gas, Gets full quickly at meals, Indigestion, Nausea, Rectal Pain and Vomiting. Female Genitourinary Not Present- Frequency, Nocturia, Painful Urination, Pelvic Pain and Urgency. Musculoskeletal Not Present- Back Pain, Joint Pain, Joint Stiffness, Muscle Pain, Muscle Weakness and Swelling of Extremities. Neurological Not Present- Decreased Memory, Fainting, Headaches, Numbness, Seizures, Tingling, Tremor, Trouble walking and Weakness. Psychiatric Not Present- Anxiety, Bipolar, Change in Sleep Pattern, Depression, Fearful and Frequent crying. Endocrine Not Present- Cold Intolerance, Excessive Hunger, Hair Changes, Heat Intolerance, Hot flashes and New Diabetes. Hematology Not Present- Blood Thinners, Easy Bruising, Excessive bleeding, Gland problems, HIV and Persistent Infections.  Ht 5\' 6"  (1.676 m)   Wt 51.3 kg   BMI 18.24 kg/m    Physical Exam   General Mental Status-Alert. General Appearance-Cooperative. CV: RRR Lungs: CTA Rectal Anorectal Exam External - normal sphincter . Internal - normal internal exam.   Procedure Other: Procedure: Anoscopy.... Marland KitchenSurgeon:  Marland Kitchen....Maisie FusMarland KitchenAfter the risks and benefits were explained, verbal consent was obtained for above procedure. A medical assistant chaperone was present thoroughout the entire procedure. ....Marland KitchenMarland KitchenAnesthesia: none....Marland KitchenMarland KitchenDiagnosis: anal bleeding....Marland KitchenMarland KitchenFindings: Grade 3 right posterior internal hemorrhoid, grade 2, right anterior, left lateral internal hemorrhoids without signs of inflammation. Good sphincter  tone.  Performed: 03/07/2020 12:04 PM    Assessment & Plan Romie Levee MD; 03/07/2020 12:04 PM)  PROLAPSED INTERNAL HEMORRHOIDS, GRADE 3 (T77.1) Impression: 39 year old female who presents to the office for evaluation of grade 3 hemorrhoids. Patient was diagnosed with ulcerative colitis in the past and have multiple bowel movements a day. She has recovered from this and has no further signs of inflammatory bowel disease on this recent colonoscopy. She is having one bowel movement a day, which is unformed. On examination today, she has a grade 3 right posterior internal hemorrhoid that appears to be the culprit of most of her bleeding and drainage. She has grade 2 hemorrhoids in the right anterior and left lateral positions that appear noninflamed. We discussed performing trans-hemorrhoidal suture pexy versus hemorrhoidectomy of the right posterior hemorrhoid and possible hemorrhoidal pexy of the left lateral, right anterior hemorrhoids. She has elected to proceed with the latter. We discussed the typical risk of postoperative pain, bleeding and recurrence. All questions were answered.

## 2020-05-11 NOTE — Transfer of Care (Signed)
Immediate Anesthesia Transfer of Care Note  Patient: Toni Stafford  Procedure(s) Performed: Procedure(s) (LRB): SINGLE COLUMN HEMORRHOIDECTOMY, HEMORRHOIDAL PEXY (N/A)  Patient Location: PACU  Anesthesia Type: MAC  Level of Consciousness: awake, alert , oriented and patient cooperative  Airway & Oxygen Therapy: Patient Spontanous Breathing on Room air Post-op Assessment: Report given to PACU RN and Post -op Vital signs reviewed and stable  Post vital signs: Reviewed and stable  Complications: No apparent anesthesia complications  Last Vitals:  Vitals Value Taken Time  BP 113/73 05/11/20 0900  Temp    Pulse 90 05/11/20 0901  Resp 24 05/11/20 0901  SpO2 100 % 05/11/20 0901  Vitals shown include unvalidated device data.  Last Pain:  Vitals:   05/11/20 0732  TempSrc: Oral  PainSc: 0-No pain      Patients Stated Pain Goal: 5 (41/63/84 5364)  Complications: No complications documented.

## 2020-05-11 NOTE — Op Note (Signed)
05/11/2020  8:44 AM  PATIENT:  Toni Stafford  39 y.o. female  Patient Care Team: Patient, No Pcp Per as PCP - General (General Practice)  PRE-OPERATIVE DIAGNOSIS:  GRADE 3 HEMORRHOIDS  POST-OPERATIVE DIAGNOSIS: Grade 3 hemorrhoids  PROCEDURE:  SINGLE COLUMN HEMORRHOIDECTOMY (R post), HEMORRHOIDAL PEXY (R ant)   Surgeon(s): Leighton Ruff, MD  ASSISTANT: none   ANESTHESIA:   local, topical and MAC  SPECIMEN:  Source of Specimen:  R post hemorrhoid  DISPOSITION OF SPECIMEN:  PATHOLOGY  COUNTS:  YES  PLAN OF CARE: Discharge to home after PACU  PATIENT DISPOSITION:  PACU - hemodynamically stable.  INDICATION: 39 y.o. F with recurrent bleeding and pain due to grade 3 hemorrhoid   OR FINDINGS: Grade 3 right posterior hemorrhoid, grade 2 right anterior hemorrhoid  DESCRIPTION: the patient was identified in the preoperative holding area and taken to the OR where they were laid on the operating room table.  MAC anesthesia was induced without difficulty. The patient was then positioned in prone jackknife position with buttocks gently taped apart.  The patient was then prepped and draped in usual sterile fashion.  SCDs were noted to be in place prior to the initiation of anesthesia. A surgical timeout was performed indicating the correct patient, procedure, positioning and need for preoperative antibiotics.  A rectal block was performed using Marcaine with epinephrine mixed with Exparel.    I began with a digital rectal exam.  The patient had good rectal tone.  I then placed a Hill-Ferguson anoscope into the anal canal and evaluated this completely.  The patient had a large grade 3 right posterior internal hemorrhoid with ulceration.  There was a smaller grade 2 right anterior internal hemorrhoid.  I proceeded with hemorrhoidectomy of the right posterior hemorrhoid.  I elevated this with a Allis clamp and divided around the edges with a 10 blade scalpel.  I then bluntly dissected the  hemorrhoid off of the sphincter complex.  I then divided the edges with Metzenbaum scissors.  I used a 2-0 chromic suture to close the anoderm to the level of the dentate line.  I then used a 3-0 chromic running suture to close the perianal skin.  I then turned my attention to the right anterior hemorrhoid.  A 3-0 Vicryl suture was used to create a hemorrhoidal pexy.  Additional local anesthesia was applied.  Lidocaine ointment was placed on the external wound and a sterile dressing was placed over this.  The patient was then awakened from anesthesia and sent to the postanesthesia care unit in stable condition.  All counts were correct per operating room staff.

## 2020-05-11 NOTE — Anesthesia Postprocedure Evaluation (Signed)
Anesthesia Post Note  Patient: Toni Stafford  Procedure(s) Performed: SINGLE COLUMN HEMORRHOIDECTOMY, HEMORRHOIDAL PEXY (N/A )     Patient location during evaluation: PACU Anesthesia Type: MAC Level of consciousness: awake and alert Pain management: pain level controlled Vital Signs Assessment: post-procedure vital signs reviewed and stable Respiratory status: spontaneous breathing, nonlabored ventilation, respiratory function stable and patient connected to nasal cannula oxygen Cardiovascular status: stable and blood pressure returned to baseline Postop Assessment: no apparent nausea or vomiting Anesthetic complications: no   No complications documented.  Last Vitals:  Vitals:   05/11/20 0945 05/11/20 1023  BP: 117/78 135/86  Pulse: 74 78  Resp: 17 16  Temp:  36.9 C  SpO2: 100% 100%    Last Pain:  Vitals:   05/11/20 1023  TempSrc:   PainSc: 0-No pain                 Belenda Cruise P Linzy Laury

## 2020-05-11 NOTE — Anesthesia Preprocedure Evaluation (Addendum)
Anesthesia Evaluation  Patient identified by MRN, date of birth, ID band Patient awake  General Assessment Comment:Patient reports "stomach pain" after 2017 colonoscopy with propofol  Reviewed: Patient's Chart, lab work & pertinent test results  History of Anesthesia Complications (+) history of anesthetic complications  Airway Mallampati: I  TM Distance: >3 FB Neck ROM: Full    Dental  (+) Teeth Intact   Pulmonary neg pulmonary ROS,    Pulmonary exam normal        Cardiovascular negative cardio ROS   Rhythm:Regular Rate:Normal     Neuro/Psych negative neurological ROS  negative psych ROS   GI/Hepatic Neg liver ROS, Hemorrhoids, UC   Endo/Other  negative endocrine ROS  Renal/GU negative Renal ROS  negative genitourinary   Musculoskeletal  (+) Arthritis , Rheumatoid disorders,    Abdominal Normal abdominal exam  (+)  Abdomen: soft. Bowel sounds: normal.  Peds negative pediatric ROS (+)  Hematology negative hematology ROS (+)   Anesthesia Other Findings   Reproductive/Obstetrics negative OB ROS                            Anesthesia Physical Anesthesia Plan  ASA: II  Anesthesia Plan: MAC   Post-op Pain Management:    Induction:   PONV Risk Score and Plan: 2 and Ondansetron and Dexamethasone  Airway Management Planned: Simple Face Mask and Nasal Cannula  Additional Equipment: None  Intra-op Plan:   Post-operative Plan:   Informed Consent: I have reviewed the patients History and Physical, chart, labs and discussed the procedure including the risks, benefits and alternatives for the proposed anesthesia with the patient or authorized representative who has indicated his/her understanding and acceptance.     Dental advisory given  Plan Discussed with: CRNA  Anesthesia Plan Comments: (Patient reports extensive abdominal pain post multiple colonoscopies in the past which  she attributes to propofol. I had extensive discussion with the patient as her symptoms much more likely 2/2 to procedural insufflation and discomfort vs anesthetic agents utilized. Patient in understanding and amenable to propofol at this time.   Covid-19 Nucleic Acid Test Results Lab Results      Component                Value               Date                      [place]              NEGATIVE            05/08/2020           Lab Results      Component                Value               Date                      PREGTESTUR               NEGATIVE            05/11/2020                HCG                      <5.0  11/24/2017          )       Anesthesia Quick Evaluation

## 2020-05-11 NOTE — Discharge Instructions (Addendum)
ANORECTAL SURGERY: POST OP INSTRUCTIONS 1. Take your usually prescribed home medications unless otherwise directed. 2. DIET: During the first few hours after surgery sip on some liquids until you are able to urinate.  It is normal to not urinate for several hours after this surgery.  If you feel uncomfortable, please contact the office for instructions.  After you are able to urinate,you may eat, if you feel like it.  Follow a light bland diet the first 24 hours after arrival home, such as soup, liquids, crackers, etc.  Be sure to include lots of fluids daily (6-8 glasses).  Avoid fast food or heavy meals, as your are more likely to get nauseated.  Eat a low fat diet the next few days after surgery.  Limit caffeine intake to 1-2 servings a day. 3. PAIN CONTROL: a. Pain is best controlled by a usual combination of several different methods TOGETHER: i. Muscle relaxation 1.  Soak in a warm bath (or Sitz bath) three times a day and after bowel movements.  Continue to do this until all pain is resolved. 2. Take the muscle relaxer (Valium) every 8 hours for the first 2-3 days after surgery.  DO NOT TAKE THIS TOGETHER WITH YOUR PAIN MEDICATIONS.  SPACE THEM AT LEAST 1-2 HRS APART. ii. Over the counter pain medication iii. Prescription pain medication b. Most patients will experience some swelling and discomfort in the anus/rectal area and incisions.  Heat such as warm towels, sitz baths, warm baths, etc to help relax tight/sore spots and speed recovery.  Some people prefer to use ice, especially in the first couple days after surgery, as it may decrease the pain and swelling, or alternate between ice & heat.  Experiment to what works for you.  Swelling and bruising can take several weeks to resolve.  Pain can take even longer to completely resolve. c. It is helpful to take an over-the-counter pain medication regularly for the first few weeks.  Choose one of the following that works best for you: i. Naproxen  (Aleve, etc)  Two 220mg  tabs twice a day ii. Ibuprofen (Advil, etc) Three 200mg  tabs four times a day (every meal & bedtime) d. A  prescription for pain medication (such as percocet, oxycodone, hydrocodone, etc) should be given to you upon discharge.  Take your pain medication as prescribed.  i. If you are having problems/concerns with the prescription medicine (does not control pain, nausea, vomiting, rash, itching, etc), please call [phone] to see if we need to switch you to a different pain medicine that will work better for you and/or control your side effect better. ii. If you need a refill on your pain medication, please contact your pharmacy.  They will contact our office to request authorization. Prescriptions will not be filled after 5 pm or on week-ends. 4. KEEP YOUR BOWELS REGULAR and AVOID CONSTIPATION a. The goal is one to two soft bowel movements a day.  You should at least have a bowel movement every other day. b. Avoid getting constipated.  Between the surgery and the pain medications, it is common to experience some constipation. This can be very painful after rectal surgery.  Increasing fluid intake and taking a fiber supplement (such as Metamucil, Citrucel, FiberCon, etc) 1-2 times a day regularly will usually help prevent this problem from occurring.  A stool softener like colace is also recommended.  This can be purchased over the counter at your pharmacy.  You can take it up to 3 times  a day.  If you do not have a bowel movement after 24 hrs since your surgery, take one does of milk of magnesia.  If you still haven't had a bowel movement 8-12 hours after that dose, take another dose.  If you don't have a bowel movement 48 hrs after surgery, purchase a Fleets enema from the drug store and administer gently per package instructions.  If you still are having trouble with your bowel movements after that, please call the office for further instructions. c. If you develop diarrhea  or have many loose bowel movements, simplify your diet to bland foods & liquids for a few days.  Stop any stool softeners and decrease your fiber supplement.  Switching to mild anti-diarrheal medications (Kayopectate, Pepto Bismol) can help.  If this worsens or does not improve, please call us.  5. Wound Care a. Remove your bandages before your first bowel movement or 8 hours after surgery.     b. Remove any wound packing material at this tim,e as well.  You do not need to repack the wound unless instructed otherwise.  Wear an absorbent pad or soft cotton gauze in your underwear to catch any drainage and help keep the area clean. You should change this every 2-3 hours while awake. c. Keep the area clean and dry.  Bathe / shower every day, especially after bowel movements.  Keep the area clean by showering / bathing over the incision / wound.   It is okay to soak an open wound to help wash it.  Wet wipes or showers / gentle washing after bowel movements is often less traumatic than regular toilet paper. d. Bonita Quin may have some styrofoam-like soft packing in the rectum which will come out with the first bowel movement.  e. You will often notice bleeding with bowel movements.  This should slow down by the end of the first week of surgery f. Expect some drainage.  This should slow down, too, by the end of the first week of surgery.  Wear an absorbent pad or soft cotton gauze in your underwear until the drainage stops. g. Do Not sit on a rubber or pillow ring.  This can make you symptoms worse.  You may sit on a soft pillow if needed.  6. ACTIVITIES as tolerated:   a. You may resume regular (light) daily activities beginning the next day--such as daily self-care, walking, climbing stairs--gradually increasing activities as tolerated.  If you can walk 30 minutes without difficulty, it is safe to try more intense activity such as jogging, treadmill, bicycling, low-impact aerobics, swimming, etc. b. Save the most  intensive and strenuous activity for last such as sit-ups, heavy lifting, contact sports, etc  Refrain from any heavy lifting or straining until you are off narcotics for pain control.   c. You may drive when you are no longer taking prescription pain medication, you can comfortably sit for long periods of time, and you can safely maneuver your car and apply brakes. d. Bonita Quin may have sexual intercourse when it is comfortable.  7. FOLLOW UP in our office a. Please call [facility] at [phone] to set up an appointment to see your surgeon in the office for a follow-up appointment approximately 3-4 weeks after your surgery. b. Make sure that you call for this appointment the day you arrive home to insure a convenient appointment time. 10. IF YOU HAVE DISABILITY OR FAMILY LEAVE FORMS, BRING THEM TO THE OFFICE FOR PROCESSING.  DO NOT GIVE THEM  TO YOUR DOCTOR.     WHEN TO CALL us [phone]: 1. Poor pain control 2. Reactions / problems with new medications (rash/itching, nausea, etc)  3. Fever over 101.5 F (38.5 C) 4. Inability to urinate 5. Nausea and/or vomiting 6. Worsening swelling or bruising 7. Continued bleeding from incision. 8. Increased pain, redness, or drainage from the incision  The clinic staff is available to answer your questions during regular business hours (8:30am-5pm).  Please don't hesitate to call and ask to speak to one of our nurses for clinical concerns.   A surgeon from [facility] is always on call at the hospitals   If you have a medical emergency, go to the nearest emergency room or call 911.    [facility] [address], [address], [place], [place]  [address] ? MAIN: [phone] ? TOLL FREE: [phone] ? FAX [phone] [email or web address]    Post Anesthesia Home Care Instructions  Activity: Get plenty of rest for the remainder of the day. A responsible individual must stay with you for 24 hours  following the procedure.  For the next 24 hours, DO NOT: -Drive a car -Advertising copywriter -Drink alcoholic beverages -Take any medication unless instructed by your physician -Make any legal decisions or sign important papers.  Meals: Start with liquid foods such as gelatin or soup. Progress to regular foods as tolerated. Avoid greasy, spicy, heavy foods. If nausea and/or vomiting occur, drink only clear liquids until the nausea and/or vomiting subsides. Call your physician if vomiting continues.  Special Instructions/Symptoms: Your throat may feel dry or sore from the anesthesia or the breathing tube placed in your throat during surgery. If this causes discomfort, gargle with warm salt water. The discomfort should disappear within 24 hours.

## 2020-05-12 ENCOUNTER — Encounter (HOSPITAL_BASED_OUTPATIENT_CLINIC_OR_DEPARTMENT_OTHER): Payer: Self-pay | Admitting: General Surgery

## 2020-05-12 LAB — SURGICAL PATHOLOGY

## 2020-05-26 DIAGNOSIS — Z419 Encounter for procedure for purposes other than remedying health state, unspecified: Secondary | ICD-10-CM | POA: Diagnosis not present

## 2020-06-26 DIAGNOSIS — Z419 Encounter for procedure for purposes other than remedying health state, unspecified: Secondary | ICD-10-CM | POA: Diagnosis not present

## 2020-07-26 DIAGNOSIS — Z419 Encounter for procedure for purposes other than remedying health state, unspecified: Secondary | ICD-10-CM | POA: Diagnosis not present

## 2020-08-26 DIAGNOSIS — Z419 Encounter for procedure for purposes other than remedying health state, unspecified: Secondary | ICD-10-CM | POA: Diagnosis not present

## 2020-08-26 LAB — HM COLONOSCOPY

## 2020-09-26 DIAGNOSIS — Z419 Encounter for procedure for purposes other than remedying health state, unspecified: Secondary | ICD-10-CM | POA: Diagnosis not present

## 2020-10-05 ENCOUNTER — Other Ambulatory Visit: Payer: Self-pay

## 2020-10-06 ENCOUNTER — Ambulatory Visit (INDEPENDENT_AMBULATORY_CARE_PROVIDER_SITE_OTHER): Payer: Managed Care, Other (non HMO) | Admitting: Nurse Practitioner

## 2020-10-06 ENCOUNTER — Encounter: Payer: Self-pay | Admitting: Nurse Practitioner

## 2020-10-06 VITALS — BP 102/60 | HR 76 | Temp 97.0°F | Ht 66.0 in | Wt 118.2 lb

## 2020-10-06 DIAGNOSIS — Z0000 Encounter for general adult medical examination without abnormal findings: Secondary | ICD-10-CM | POA: Diagnosis not present

## 2020-10-06 DIAGNOSIS — Z113 Encounter for screening for infections with a predominantly sexual mode of transmission: Secondary | ICD-10-CM | POA: Diagnosis not present

## 2020-10-06 DIAGNOSIS — N63 Unspecified lump in unspecified breast: Secondary | ICD-10-CM | POA: Insufficient documentation

## 2020-10-06 DIAGNOSIS — M0579 Rheumatoid arthritis with rheumatoid factor of multiple sites without organ or systems involvement: Secondary | ICD-10-CM

## 2020-10-06 DIAGNOSIS — Z136 Encounter for screening for cardiovascular disorders: Secondary | ICD-10-CM | POA: Diagnosis not present

## 2020-10-06 DIAGNOSIS — Z13220 Encounter for screening for lipoid disorders: Secondary | ICD-10-CM | POA: Diagnosis not present

## 2020-10-06 DIAGNOSIS — M3500 Sicca syndrome, unspecified: Secondary | ICD-10-CM | POA: Insufficient documentation

## 2020-10-06 DIAGNOSIS — K52832 Lymphocytic colitis: Secondary | ICD-10-CM | POA: Insufficient documentation

## 2020-10-06 DIAGNOSIS — K5190 Ulcerative colitis, unspecified, without complications: Secondary | ICD-10-CM | POA: Insufficient documentation

## 2020-10-06 LAB — CBC WITH DIFFERENTIAL/PLATELET
Basophils Absolute: 0 10*3/uL (ref 0.0–0.1)
Basophils Relative: 0.4 % (ref 0.0–3.0)
Eosinophils Absolute: 0 10*3/uL (ref 0.0–0.7)
Eosinophils Relative: 0.7 % (ref 0.0–5.0)
HCT: 36.6 % (ref 36.0–46.0)
Hemoglobin: 12.2 g/dL (ref 12.0–15.0)
Lymphocytes Relative: 39.7 % (ref 12.0–46.0)
Lymphs Abs: 1.2 10*3/uL (ref 0.7–4.0)
MCHC: 33.3 g/dL (ref 30.0–36.0)
MCV: 86.1 fl (ref 78.0–100.0)
Monocytes Absolute: 0.3 10*3/uL (ref 0.1–1.0)
Monocytes Relative: 8.7 % (ref 3.0–12.0)
Neutro Abs: 1.6 10*3/uL (ref 1.4–7.7)
Neutrophils Relative %: 50.5 % (ref 43.0–77.0)
Platelets: 206 10*3/uL (ref 150.0–400.0)
RBC: 4.26 Mil/uL (ref 3.87–5.11)
RDW: 13.5 % (ref 11.5–15.5)
WBC: 3.1 10*3/uL — ABNORMAL LOW (ref 4.0–10.5)

## 2020-10-06 LAB — COMPREHENSIVE METABOLIC PANEL
ALT: 14 U/L (ref 0–35)
AST: 19 U/L (ref 0–37)
Albumin: 4.5 g/dL (ref 3.5–5.2)
Alkaline Phosphatase: 56 U/L (ref 39–117)
BUN: 11 mg/dL (ref 6–23)
CO2: 29 mEq/L (ref 19–32)
Calcium: 9.4 mg/dL (ref 8.4–10.5)
Chloride: 102 mEq/L (ref 96–112)
Creatinine, Ser: 0.65 mg/dL (ref 0.40–1.20)
GFR: 109.42 mL/min (ref >60.00)
Glucose, Bld: 87 mg/dL (ref 70–99)
Potassium: 3.8 mEq/L (ref 3.5–5.1)
Sodium: 139 mEq/L (ref 135–145)
Total Bilirubin: 0.6 mg/dL (ref 0.2–1.2)
Total Protein: 8.1 g/dL (ref 6.0–8.3)

## 2020-10-06 LAB — LIPID PANEL
Cholesterol: 172 mg/dL (ref 0–200)
HDL: 86 mg/dL (ref >39.00)
LDL Cholesterol: 77 mg/dL (ref 0–99)
NonHDL: 85.72
Total CHOL/HDL Ratio: 2
Triglycerides: 43 mg/dL (ref 0.0–149.0)
VLDL: 8.6 mg/dL (ref 0.0–40.0)

## 2020-10-06 NOTE — Assessment & Plan Note (Signed)
>>  ASSESSMENT AND PLAN FOR CHRONIC ULCERATIVE COLITIS ([facility]) WRITTEN ON 10/06/2020 12:35 PM BY Shyne Resch LUM, NP  No diarrhea or blood in stool or ABD pain No weight loss Wt Readings from Last 3 Encounters:  10/06/20 118 lb 3.2 oz (53.6 kg)  05/11/20 112 lb 11.2 oz (51.1 kg)  10/12/14 106 lb (48.1 kg)  reports last colonoscopy was completed by Dr. Loreta Ave Will obtain records

## 2020-10-06 NOTE — Assessment & Plan Note (Signed)
No diarrhea or blood in stool or ABD pain No weight loss Wt Readings from Last 3 Encounters:  10/06/20 118 lb 3.2 oz (53.6 kg)  05/11/20 112 lb 11.2 oz (51.1 kg)  10/12/14 106 lb (48.1 kg)  reports last colonoscopy was completed by Dr. Loreta Ave Will obtain records

## 2020-10-06 NOTE — Progress Notes (Signed)
Subjective:    Patient ID: Toni Stafford, female    DOB: 1980/12/14, 40 y.o.   MRN: [id number]  Patient presents today for CPE   HPI  Sexual History (orientation,birth control, marital status, STD):IUD is place. Up to date with pelvic and breast exam. Completed by GYN per patient  Depression/Suicide: Depression screen [facility] 2/9 10/06/2020  Decreased Interest 0  Down, Depressed, Hopeless 0  PHQ - 2 Score 0   Vision:up to date  Dental:up to date  Immunizations: (TDAP, Hep C screen, Pneumovax, Influenza, zoster)  Health Maintenance  Topic Date Due  .  Hepatitis C: One time screening is recommended by Center for Disease Control  (CDC) for  adults born from [birth year] through 1967.   Never done  . HIV Screening  Never done  . Tetanus Vaccine  Never done  . Pap Smear  02/13/2019  . COVID-19 Vaccine (3 - Booster for Moderna series) 06/07/2020  . Flu Shot  11/23/2020*  *Topic was postponed. The date shown is not the original due date.   Diet:regular.  Weight:  Wt Readings from Last 3 Encounters:  10/06/20 118 lb 3.2 oz (53.6 kg)  05/11/20 112 lb 11.2 oz (51.1 kg)  10/12/14 106 lb (48.1 kg)   Fall Risk: Fall Risk  10/06/2020  Falls in the past year? 0  Number falls in past yr: 0  Injury with Fall? 0  Risk for fall due to : No Fall Risks  Follow up Falls evaluation completed   Medications and allergies reviewed with patient and updated if appropriate.  Patient Active Problem List   Diagnosis Date Noted  . Breast lump 10/06/2020  . Chronic ulcerative colitis (HCC) 10/06/2020  . History of Sjogren's disease (HCC) 10/06/2020  . Rheumatoid arthritis (HCC) 04/25/2017    Current Outpatient Medications on File Prior to Visit  Medication Sig Dispense Refill  . levonorgestrel (MIRENA) 20 MCG/24HR IUD 1 each by Intrauterine route once.    . Multiple Vitamin (MULTIVITAMIN WITH MINERALS) TABS tablet Take 1 tablet by mouth daily. Liquid 1 table spoon a day    . Turmeric Curcumin 500  MG CAPS Take 500 mg by mouth. Takes 3 times per week.     No current facility-administered medications on file prior to visit.   Past Medical History:  Diagnosis Date  . Arthritis    ra no meds taken  . Complication of anesthesia    stomach pain after colonscopy 2017, prefers not to use propofol  . Hemorrhoids   . Ulcerative colitis    none seen on 2017 or 2021 colonscopy no meds taken   Past Surgical History:  Procedure Laterality Date  . colonscopy  2017, may 2021   dr Loreta Ave  . HEMORRHOID SURGERY N/A 05/11/2020   Procedure: SINGLE COLUMN HEMORRHOIDECTOMY, HEMORRHOIDAL PEXY;  Surgeon: Romie Levee, MD;  Location: [facility];  Service: General;  Laterality: N/A;  . KNEE SURGERY Left 1998  . TUBAL LIGATION      Social History   Socioeconomic History  . Marital status: Single    Spouse name: Not on file  . Number of children: Not on file  . Years of education: Not on file  . Highest education level: Not on file  Occupational History  . Not on file  Tobacco Use  . Smoking status: Never Smoker  . Smokeless tobacco: Never Used  Vaping Use  . Vaping Use: Never used  Substance and Sexual Activity  . Alcohol use: Yes  Comment: occ once a month wine  . Drug use: Never  . Sexual activity: Not on file  Other Topics Concern  . Not on file  Social History Narrative  . Not on file   Social Determinants of Health   Financial Resource Strain: Not on file  Food Insecurity: Not on file  Transportation Needs: Not on file  Physical Activity: Not on file  Stress: Not on file  Social Connections: Not on file   Family History  Problem Relation Age of Onset  . Hypertension Mother        Review of Systems  Constitutional: Negative for fever, malaise/fatigue and weight loss.  HENT: Negative for congestion and sore throat.   Eyes:       Negative for visual changes  Respiratory: Negative for cough and shortness of breath.   Cardiovascular: Negative for  chest pain, palpitations and leg swelling.  Gastrointestinal: Negative for blood in stool, constipation, diarrhea and heartburn.  Genitourinary: Negative for dysuria, frequency and urgency.  Musculoskeletal: Negative for falls, joint pain and myalgias.  Skin: Positive for rash.       Chronic scaly rash on neck region after methotrexate use  Neurological: Negative for dizziness, sensory change and headaches.  Endo/Heme/Allergies: Does not bruise/bleed easily.  Psychiatric/Behavioral: Negative for depression, hallucinations, substance abuse and suicidal ideas. The patient is not nervous/anxious and does not have insomnia.    Objective:   Vitals:   10/06/20 0811  BP: 102/60  Pulse: 76  Temp: (!) 97 F (36.1 C)  SpO2: 98%   Body mass index is 19.08 kg/m.  Physical Examination:  Physical Exam Vitals reviewed.  Constitutional:      General: She is not in acute distress.    Appearance: She is well-developed.  HENT:     Right Ear: Tympanic membrane, ear canal and external ear normal.     Left Ear: Tympanic membrane, ear canal and external ear normal.     Mouth/Throat:     Mouth: Oropharynx is clear and moist.  Eyes:     Extraocular Movements: Extraocular movements intact and EOM normal.     Conjunctiva/sclera: Conjunctivae normal.  Cardiovascular:     Rate and Rhythm: Normal rate and regular rhythm.     Heart sounds: Normal heart sounds.  Pulmonary:     Effort: Pulmonary effort is normal. No respiratory distress.     Breath sounds: Normal breath sounds.  Chest:     Chest wall: No tenderness.  Abdominal:     General: Bowel sounds are normal.     Palpations: Abdomen is soft.  Genitourinary:    Comments: Deferred breast and pelvic exam to GYN. Will obtain records Musculoskeletal:        General: No edema. Normal range of motion.     Cervical back: Normal range of motion and neck supple.     Right lower leg: No edema.     Left lower leg: No edema.  Lymphadenopathy:      Cervical: No cervical adenopathy.  Skin:    General: Skin is warm and dry.     Findings: Rash present. No erythema.  Neurological:     Mental Status: She is alert and oriented to person, place, and time.     Deep Tendon Reflexes: Reflexes are normal and symmetric.  Psychiatric:        Mood and Affect: Mood normal.        Behavior: Behavior normal.        Thought Content: Thought  content normal.    ASSESSMENT and PLAN: This visit occurred during the SARS-CoV-2 public health emergency.  Safety protocols were in place, including screening questions prior to the visit, additional usage of staff PPE, and extensive cleaning of exam room while observing appropriate contact time as indicated for disinfecting solutions.   Rakia was seen today for establish care.  Diagnoses and all orders for this visit:  Preventative health care -     CBC with Differential/Platelet -     Comprehensive metabolic panel -     Lipid panel  Encounter for lipid screening for cardiovascular disease -     Lipid panel  Screen for STD (sexually transmitted disease) -     HIV antibody (with reflex) -     RPR -     Hepatitis C Antibody -     Urine cytology ancillary only([place]); Future  Rheumatoid arthritis involving multiple sites with positive rheumatoid factor (HCC)  Chronic ulcerative colitis without complication, unspecified location [facility])      Problem List Items Addressed This Visit      Digestive   Chronic ulcerative colitis (HCC)    No diarrhea or blood in stool or ABD pain No weight loss Wt Readings from Last 3 Encounters:  10/06/20 118 lb 3.2 oz (53.6 kg)  05/11/20 112 lb 11.2 oz (51.1 kg)  10/12/14 106 lb (48.1 kg)  reports last colonoscopy was completed by Dr. Loreta Ave Will obtain records        Musculoskeletal and Integument   Rheumatoid arthritis (HCC)    Denies need for medication No joint pain/stiffness/swelling, or malaise. Previously under Dr. Ellin Mayhew care.        Other Visit Diagnoses    Preventative health care    -  Primary   Relevant Orders   CBC with Differential/Platelet   Comprehensive metabolic panel   Lipid panel   Encounter for lipid screening for cardiovascular disease       Relevant Orders   Lipid panel   Screen for STD (sexually transmitted disease)       Relevant Orders   HIV antibody (with reflex)   RPR   Hepatitis C Antibody   Urine cytology ancillary only([place])      Follow up: Return in about 1 year (around 10/06/2021) for CPE (fasting).  Alysia Penna, NP

## 2020-10-06 NOTE — Assessment & Plan Note (Signed)
Denies need for medication No joint pain/stiffness/swelling, or malaise. Previously under Dr. Ellin Mayhew care.

## 2020-10-06 NOTE — Patient Instructions (Signed)
Thank you for choosing [facility]  Go to lab for blood draw  Sign medical release to get records from Dr. Harrington Challenger and Dr. Collene Mares.  Preventive Care 31-42 Years Old, Female Preventive care refers to lifestyle choices and visits with your health care provider that can promote health and wellness. This includes:  A yearly physical exam. This is also called an annual wellness visit.  Regular dental and eye exams.  Immunizations.  Screening for certain conditions.  Healthy lifestyle choices, such as: ? Eating a healthy diet. ? Getting regular exercise. ? Not using drugs or products that contain nicotine and tobacco. ? Limiting alcohol use. What can I expect for my preventive care visit? Physical exam Your health care provider will check your:  Height and weight. These may be used to calculate your BMI (body mass index). BMI is a measurement that tells if you are at a healthy weight.  Heart rate and blood pressure.  Body temperature.  Skin for abnormal spots. Counseling Your health care provider may ask you questions about your:  Past medical problems.  Family's medical history.  Alcohol, tobacco, and drug use.  Emotional well-being.  Home life and relationship well-being.  Sexual activity.  Diet, exercise, and sleep habits.  Work and work Statistician.  Access to firearms.  Method of birth control.  Menstrual cycle.  Pregnancy history. What immunizations do I need? Vaccines are usually given at various ages, according to a schedule. Your health care provider will recommend vaccines for you based on your age, medical history, and lifestyle or other factors, such as travel or where you work.   What tests do I need? Blood tests  Lipid and cholesterol levels. These may be checked every 5 years, or more often if you are over 68 years old.  Hepatitis C test.  Hepatitis B test. Screening  Lung cancer screening. You may have this screening every year  starting at age 40 if you have a 30-pack-year history of smoking and currently smoke or have quit within the past 15 years.  Colorectal cancer screening. ? All adults should have this screening starting at age 32 and continuing until age 62. ? Your health care provider may recommend screening at age 73 if you are at increased risk. ? You will have tests every 1-10 years, depending on your results and the type of screening test.  Diabetes screening. ? This is done by checking your blood sugar (glucose) after you have not eaten for a while (fasting). ? You may have this done every 1-3 years.  Mammogram. ? This may be done every 1-2 years. ? Talk with your health care provider about when you should start having regular mammograms. This may depend on whether you have a family history of breast cancer.  BRCA-related cancer screening. This may be done if you have a family history of breast, ovarian, tubal, or peritoneal cancers.  Pelvic exam and Pap test. ? This may be done every 3 years starting at age 80. ? Starting at age 3, this may be done every 5 years if you have a Pap test in combination with an HPV test. Other tests  STD (sexually transmitted disease) testing, if you are at risk.  Bone density scan. This is done to screen for osteoporosis. You may have this scan if you are at high risk for osteoporosis. Talk with your health care provider about your test results, treatment options, and if necessary, the need for more tests. Follow these instructions at  home: Eating and drinking  Eat a diet that includes fresh fruits and vegetables, whole grains, lean protein, and low-fat dairy products.  Take vitamin and mineral supplements as recommended by your health care provider.  Do not drink alcohol if: ? Your health care provider tells you not to drink. ? You are pregnant, may be pregnant, or are planning to become pregnant.  If you drink alcohol: ? Limit how much you have to 0-1  drink a day. ? Be aware of how much alcohol is in your drink. In the [place] one drink equals one 12 oz bottle of beer (355 mL), one 5 oz glass of wine (148 mL), or one 1 oz glass of hard liquor (44 mL).   Lifestyle  Take daily care of your teeth and gums. Brush your teeth every morning and night with fluoride toothpaste. Floss one time each day.  Stay active. Exercise for at least 30 minutes 5 or more days each week.  Do not use any products that contain nicotine or tobacco, such as cigarettes, e-cigarettes, and chewing tobacco. If you need help quitting, ask your health care provider.  Do not use drugs.  If you are sexually active, practice safe sex. Use a condom or other form of protection to prevent STIs (sexually transmitted infections).  If you do not wish to become pregnant, use a form of birth control. If you plan to become pregnant, see your health care provider for a prepregnancy visit.  If told by your health care provider, take low-dose aspirin daily starting at age 31.  Find healthy ways to cope with stress, such as: ? Meditation, yoga, or listening to music. ? Journaling. ? Talking to a trusted person. ? Spending time with friends and family. Safety  Always wear your seat belt while driving or riding in a vehicle.  Do not drive: ? If you have been drinking alcohol. Do not ride with someone who has been drinking. ? When you are tired or distracted. ? While texting.  Wear a helmet and other protective equipment during sports activities.  If you have firearms in your house, make sure you follow all gun safety procedures. What's next?  Visit your health care provider once a year for an annual wellness visit.  Ask your health care provider how often you should have your eyes and teeth checked.  Stay up to date on all vaccines. This information is not intended to replace advice given to you by your health care provider. Make sure you discuss any questions you have  with your health care provider. Document Revised: 05/16/2020 Document Reviewed: 04/23/2018 Elsevier Patient Education  2021 [employer].

## 2020-10-09 LAB — RPR: RPR Ser Ql: NONREACTIVE

## 2020-10-09 LAB — HEPATITIS C ANTIBODY
Hepatitis C Ab: NONREACTIVE
SIGNAL TO CUT-OFF: 0.1 (ref <1.00)

## 2020-10-09 LAB — HIV ANTIBODY (ROUTINE TESTING W REFLEX): HIV 1&2 Ab, 4th Generation: NONREACTIVE

## 2020-10-24 DIAGNOSIS — Z419 Encounter for procedure for purposes other than remedying health state, unspecified: Secondary | ICD-10-CM | POA: Diagnosis not present

## 2020-11-24 DIAGNOSIS — Z419 Encounter for procedure for purposes other than remedying health state, unspecified: Secondary | ICD-10-CM | POA: Diagnosis not present

## 2020-12-24 DIAGNOSIS — Z419 Encounter for procedure for purposes other than remedying health state, unspecified: Secondary | ICD-10-CM | POA: Diagnosis not present

## 2021-01-24 DIAGNOSIS — Z419 Encounter for procedure for purposes other than remedying health state, unspecified: Secondary | ICD-10-CM | POA: Diagnosis not present

## 2021-02-23 DIAGNOSIS — Z419 Encounter for procedure for purposes other than remedying health state, unspecified: Secondary | ICD-10-CM | POA: Diagnosis not present

## 2021-03-26 DIAGNOSIS — Z419 Encounter for procedure for purposes other than remedying health state, unspecified: Secondary | ICD-10-CM | POA: Diagnosis not present

## 2021-04-26 DIAGNOSIS — Z419 Encounter for procedure for purposes other than remedying health state, unspecified: Secondary | ICD-10-CM | POA: Diagnosis not present

## 2021-05-26 DIAGNOSIS — Z419 Encounter for procedure for purposes other than remedying health state, unspecified: Secondary | ICD-10-CM | POA: Diagnosis not present

## 2021-06-26 DIAGNOSIS — Z419 Encounter for procedure for purposes other than remedying health state, unspecified: Secondary | ICD-10-CM | POA: Diagnosis not present

## 2021-07-26 DIAGNOSIS — Z419 Encounter for procedure for purposes other than remedying health state, unspecified: Secondary | ICD-10-CM | POA: Diagnosis not present

## 2021-08-26 DIAGNOSIS — Z419 Encounter for procedure for purposes other than remedying health state, unspecified: Secondary | ICD-10-CM | POA: Diagnosis not present

## 2021-09-26 DIAGNOSIS — Z419 Encounter for procedure for purposes other than remedying health state, unspecified: Secondary | ICD-10-CM | POA: Diagnosis not present

## 2021-10-09 ENCOUNTER — Other Ambulatory Visit: Payer: Self-pay

## 2021-10-10 ENCOUNTER — Other Ambulatory Visit (HOSPITAL_COMMUNITY)
Admission: RE | Admit: 2021-10-10 | Discharge: 2021-10-10 | Disposition: A | Discharge status: Home / Self Care | Payer: Managed Care, Other (non HMO) | Source: Ambulatory Visit | Attending: Nurse Practitioner | Admitting: Nurse Practitioner

## 2021-10-10 ENCOUNTER — Encounter: Payer: Self-pay | Admitting: Nurse Practitioner

## 2021-10-10 ENCOUNTER — Ambulatory Visit (INDEPENDENT_AMBULATORY_CARE_PROVIDER_SITE_OTHER): Payer: Managed Care, Other (non HMO) | Admitting: Nurse Practitioner

## 2021-10-10 VITALS — BP 104/64 | HR 68 | Temp 97.8°F | Ht 65.25 in | Wt 108.4 lb

## 2021-10-10 DIAGNOSIS — R634 Abnormal weight loss: Secondary | ICD-10-CM | POA: Diagnosis not present

## 2021-10-10 DIAGNOSIS — E638 Other specified nutritional deficiencies: Secondary | ICD-10-CM

## 2021-10-10 DIAGNOSIS — D519 Vitamin B12 deficiency anemia, unspecified: Secondary | ICD-10-CM | POA: Diagnosis not present

## 2021-10-10 DIAGNOSIS — Z13220 Encounter for screening for lipoid disorders: Secondary | ICD-10-CM

## 2021-10-10 DIAGNOSIS — Z113 Encounter for screening for infections with a predominantly sexual mode of transmission: Secondary | ICD-10-CM | POA: Insufficient documentation

## 2021-10-10 DIAGNOSIS — Z0001 Encounter for general adult medical examination with abnormal findings: Secondary | ICD-10-CM

## 2021-10-10 DIAGNOSIS — Z23 Encounter for immunization: Secondary | ICD-10-CM

## 2021-10-10 DIAGNOSIS — Z136 Encounter for screening for cardiovascular disorders: Secondary | ICD-10-CM

## 2021-10-10 LAB — CBC WITH DIFFERENTIAL/PLATELET
Basophils Absolute: 0 10*3/uL (ref 0.0–0.1)
Basophils Relative: 1 % (ref 0.0–3.0)
Eosinophils Absolute: 0 10*3/uL (ref 0.0–0.7)
Eosinophils Relative: 0.4 % (ref 0.0–5.0)
HCT: 35.5 % — ABNORMAL LOW (ref 36.0–46.0)
Hemoglobin: 11.6 g/dL — ABNORMAL LOW (ref 12.0–15.0)
Lymphocytes Relative: 43.9 % (ref 12.0–46.0)
Lymphs Abs: 1.5 10*3/uL (ref 0.7–4.0)
MCHC: 32.7 g/dL (ref 30.0–36.0)
MCV: 87.6 fl (ref 78.0–100.0)
Monocytes Absolute: 0.3 10*3/uL (ref 0.1–1.0)
Monocytes Relative: 8.4 % (ref 3.0–12.0)
Neutro Abs: 1.5 10*3/uL (ref 1.4–7.7)
Neutrophils Relative %: 46.3 % (ref 43.0–77.0)
Platelets: 220 10*3/uL (ref 150.0–400.0)
RBC: 4.05 Mil/uL (ref 3.87–5.11)
RDW: 13.4 % (ref 11.5–15.5)
WBC: 3.3 10*3/uL — ABNORMAL LOW (ref 4.0–10.5)

## 2021-10-10 LAB — FOLATE: Folate: 18.3 ng/mL (ref >5.9)

## 2021-10-10 LAB — TSH: TSH: 3.35 u[IU]/mL (ref 0.35–5.50)

## 2021-10-10 LAB — VITAMIN D 25 HYDROXY (VIT D DEFICIENCY, FRACTURES): VITD: 58.92 ng/mL (ref 30.00–100.00)

## 2021-10-10 LAB — VITAMIN B12: Vitamin B-12: 189 pg/mL — ABNORMAL LOW (ref 211–911)

## 2021-10-10 NOTE — Patient Instructions (Signed)
Go to lab for blood draw and urine collection  Call office for appt if  continuous weight loss after 66months  High-Protein and High-Calorie Diet Eating high-protein and high-calorie foods can help you to gain weight, heal after an injury, and recover after an illness or surgery. The specific amount of daily protein and calories you need depends on: Your body weight. The reason this diet is recommended for you. Generally, a high-protein, high-calorie diet involves: Eating 250-500 extra calories each day. Making sure that you get enough of your daily calories from protein. Ask your health care provider how many of your calories should come from protein. Talk with a health care provider or a dietitian about how much protein and how many calories you need each day. Follow the diet as directed by your health care provider. What are tips for following this plan? Reading food labels Check the nutrition facts label for calories, grams of fat and protein. Items with more than 4 grams of protein are high-protein foods. Preparing meals Add whole milk, half-and-half, or heavy cream to cereal, pudding, soup, or hot cocoa. Add whole milk to instant breakfast drinks. Add peanut butter to oatmeal or smoothies. Add powdered milk to baked goods, smoothies, or milkshakes. Add powdered milk, cream, or butter to mashed potatoes. Add cheese to cooked vegetables. Make whole-milk yogurt parfaits. Top them with granola, fruit, or nuts. Add cottage cheese to fruit. Add avocado, cheese, or both to sandwiches or salads. Add avocado to smoothies. Add meat, poultry, or seafood to rice, pasta, casseroles, salads, and soups. Use mayonnaise when making egg salad, chicken salad, or tuna salad. Use peanut butter as a dip for fruits and vegetables or as a topping for pretzels, celery, or crackers. Add beans to casseroles, dips, and spreads. Add pureed beans to sauces and soups. Replace calorie-free drinks with  calorie-containing drinks, such as milk and fruit juice. Replace water with milk or heavy cream when making foods such as oatmeal, pudding, or cocoa. Add oil or butter to cooked vegetables and grains. Add cream cheese to sandwiches or as a topping on crackers and bread. Make cream-based pastas and soups. General information Ask your health care provider if you should take a nutritional supplement. Try to eat six small meals each day instead of three large meals. A general goal is to eat every 2 to 3 hours. Eat a balanced diet. In each meal, include one food that is high in protein and one food with fat in it. Keep nutritious snacks available, such as nuts, trail mixes, dried fruit, and yogurt. If you have kidney disease or diabetes, talk with your health care provider about how much protein is safe for you. Too much protein may put extra stress on your kidneys. Drink your calories. Choose high-calorie drinks and have them after your meals. Consider setting a timer to remind you to eat. You will want to eat even if you do not feel very hungry. What high-protein foods should I eat? Vegetables Soybeans. Peas. Grains Quinoa. Bulgur wheat. Buckwheat. Meats and other proteins Beef, pork, and poultry. Fish and seafood. Eggs. Tofu. Textured vegetable protein (TVP). Peanut butter. Nuts and seeds. Dried beans. Protein powders. Hummus. Dairy Whole milk. Whole-milk yogurt. Powdered milk. Cheese. [employer]. Eggnog. Beverages High-protein supplement drinks. Soy milk. Other foods Protein bars. The items listed above may not be a complete list of foods and beverages you can eat and drink. Contact a dietitian for more information. What high-calorie foods should I eat? Fruits Dried  fruit. Fruit leather. Canned fruit in syrup. Fruit juice. Avocado. Vegetables Vegetables cooked in oil or butter. Fried potatoes. Grains Pasta. Quick breads. Muffins. Pancakes. Ready-to-eat cereal. Meats and other  proteins Peanut butter. Nuts and seeds. Dairy Heavy cream. Whipped cream. Cream cheese. Sour cream. Ice cream. Custard. Pudding. Whole milk dairy products. Beverages Meal-replacement beverages. Nutrition shakes. Fruit juice. Seasonings and condiments Salad dressing. Mayonnaise. Alfredo sauce. Fruit preserves or jelly. Honey. Syrup. Sweets and desserts Cake. Cookies. Pie. Pastries. Candy bars. Chocolate. Fats and oils Butter or margarine. Oil. Gravy. Other foods Meal-replacement bars. The items listed above may not be a complete list of foods and beverages you can eat and drink. Contact a dietitian for more information. Summary A high-protein, high-calorie diet can help you gain weight or heal faster after an injury, illness, or surgery. To increase your protein and calories, add ingredients such as whole milk, peanut butter, cheese, beans, meat, or seafood to meal items. To get enough extra calories each day, include high-calorie foods and beverages at each meal. Adding a high-calorie drink or shake can be an easy way to help you get enough calories each day. Talk with your healthcare provider or dietitian about the best options for you. This information is not intended to replace advice given to you by your health care provider. Make sure you discuss any questions you have with your health care provider. Document Revised: 07/16/2020 Document Reviewed: 07/16/2020 Elsevier Patient Education  2022 [employer].

## 2021-10-10 NOTE — Assessment & Plan Note (Signed)
Wt Readings from Last 3 Encounters:  10/10/21 108 lb 6.4 oz (49.2 kg)  10/06/20 118 lb 3.2 oz (53.6 kg)  05/11/20 112 lb 11.2 oz (51.1 kg)  admits to decreased meal intake due to work schedule. She eats per day. Maintains a pescaterian diet, no diary consumption. Hx of UC, last colonoscopy 2021 (normal per patient). No change in GI function  Encourage to maintain a day and 2snacks. Provided printed information. Check TSH, B12, iron, folate and vit D. F/up in 74months is no improvement

## 2021-10-10 NOTE — Progress Notes (Signed)
Subjective:    Patient ID: Toni Stafford, female    DOB: 30-Sep-1980, 41 y.o.   MRN: [id number]  Patient presents today for CPE and eval of chronic conditions  HPI Weight loss, non-intentional Wt Readings from Last 3 Encounters:  10/10/21 108 lb 6.4 oz (49.2 kg)  10/06/20 118 lb 3.2 oz (53.6 kg)  05/11/20 112 lb 11.2 oz (51.1 kg)  admits to decreased meal intake due to work schedule. She eats 68meal per day. Maintains a pescaterian diet, no diary consumption. Hx of UC, last colonoscopy 2021 (normal per patient). No change in GI function  Encourage to maintain 47meals a day and 2snacks. Provided printed information. Check TSH, B12, iron, folate and vit D. F/up in 27months is no improvement  Vision:will schedule Dental:up to date Diet:pescaterian Exercise:resistant training 3x/week Weight:  Wt Readings from Last 3 Encounters:  10/10/21 108 lb 6.4 oz (49.2 kg)  10/06/20 118 lb 3.2 oz (53.6 kg)  05/11/20 112 lb 11.2 oz (51.1 kg)   Sexual History (orientation,birth control, marital status, STD):sexually active, pelvic and breast exam completed by GYN, up to date with PAP and mammogram. Requesting for STD screen today.  Depression/Suicide: Depression screen [facility] 2/9 10/10/2021 10/06/2020  Decreased Interest 0 0  Down, Depressed, Hopeless 0 0  PHQ - 2 Score 0 0   Immunizations: (TDAP, Hep C screen, Pneumovax, Influenza, zoster)  Health Maintenance  Topic Date Due   Tetanus Vaccine  Never done   COVID-19 Vaccine (3 - Booster for Moderna series) 02/01/2020   Flu Shot  11/23/2021*   Pap Smear  12/14/2023   Hepatitis C Screening: USPSTF Recommendation to screen - Ages 18-79 yo.  Completed   HIV Screening  Completed   HPV Vaccine  Aged Out  *Topic was postponed. The date shown is not the original due date.   Fall Risk: Fall Risk  10/10/2021 10/06/2020  Falls in the past year? 0 0  Number falls in past yr: 0 0  Injury with Fall? 0 0  Risk for fall due to : No Fall Risks No Fall  Risks  Follow up Falls evaluation completed Falls evaluation completed   Advanced Directive: Advanced Directives 05/11/2020  Does Patient Have a Medical Advance Directive? No  Would patient like information on creating a medical advance directive? Yes (Inpatient - patient defers creating a medical advance directive at this time - Information given)    Medications and allergies reviewed with patient and updated if appropriate.  Patient Active Problem List   Diagnosis Date Noted   Weight loss, non-intentional 10/10/2021   Breast lump 10/06/2020   Chronic ulcerative colitis ([place]) 10/06/2020   History of Sjogren's disease ([place]) 10/06/2020   Rheumatoid arthritis ([place]) 04/25/2017   Current Outpatient Medications on File Prior to Visit  Medication Sig Dispense Refill   levonorgestrel (MIRENA) 20 MCG/24HR IUD 1 each by Intrauterine route once.     Multiple Vitamin (MULTIVITAMIN WITH MINERALS) TABS tablet Take 1 tablet by mouth daily. Liquid 1 table spoon a day     Turmeric Curcumin 500 MG CAPS Take 500 mg by mouth. Takes 3 times per week.     No current facility-administered medications on file prior to visit.    Past Medical History:  Diagnosis Date   Arthritis    ra no meds taken   Complication of anesthesia    stomach pain after colonscopy 2017, prefers not to use propofol   Hemorrhoids    Ulcerative colitis    none seen on  2017 or 2021 colonscopy no meds taken    Past Surgical History:  Procedure Laterality Date   colonscopy  2017, may 2021   dr Collene Mares   HEMORRHOID SURGERY N/A 05/11/2020   Procedure: SINGLE COLUMN HEMORRHOIDECTOMY, HEMORRHOIDAL PEXY;  Surgeon: Leighton Ruff, MD;  Location: [facility];  Service: General;  Laterality: N/A;   KNEE SURGERY Left 1998   TUBAL LIGATION      Social History   Socioeconomic History   Marital status: Single    Spouse name: Not on file   Number of children: Not on file   Years of education: Not on file   Highest  education level: Not on file  Occupational History   Not on file  Tobacco Use   Smoking status: Never   Smokeless tobacco: Never  Vaping Use   Vaping Use: Never used  Substance and Sexual Activity   Alcohol use: Yes    Comment: occ once a month wine   Drug use: Never   Sexual activity: Not on file  Other Topics Concern   Not on file  Social History Narrative   Not on file   Social Determinants of Health   Financial Resource Strain: Not on file  Food Insecurity: Not on file  Transportation Needs: Not on file  Physical Activity: Not on file  Stress: Not on file  Social Connections: Not on file    Family History  Problem Relation Age of Onset   Hypertension Mother        Review of Systems  Constitutional:  Positive for malaise/fatigue and weight loss. Negative for fever.  HENT:  Negative for congestion and sore throat.   Eyes:        Negative for visual changes  Respiratory:  Negative for cough and shortness of breath.   Cardiovascular:  Negative for chest pain, palpitations and leg swelling.  Gastrointestinal:  Negative for blood in stool, constipation, diarrhea and heartburn.  Genitourinary:  Negative for dysuria, frequency and urgency.  Musculoskeletal:  Negative for falls, joint pain and myalgias.  Skin:  Negative for rash.  Neurological:  Negative for dizziness, sensory change and headaches.  Endo/Heme/Allergies:  Does not bruise/bleed easily.  Psychiatric/Behavioral:  Negative for depression, hallucinations, substance abuse and suicidal ideas. The patient is not nervous/anxious and does not have insomnia.    Objective:   Vitals:   10/10/21 0822  BP: 104/64  Pulse: 68  Temp: 97.8 F (36.6 C)  SpO2: 100%   Body mass index is 17.9 kg/m.  Physical Examination:  Physical Exam Vitals and nursing note reviewed.  Constitutional:      General: She is not in acute distress. HENT:     Right Ear: Tympanic membrane, ear canal and external ear normal.      Left Ear: Tympanic membrane, ear canal and external ear normal.  Eyes:     General: No scleral icterus.    Extraocular Movements: Extraocular movements intact.     Conjunctiva/sclera: Conjunctivae normal.  Neck:     Thyroid: No thyroid mass, thyromegaly or thyroid tenderness.  Cardiovascular:     Rate and Rhythm: Normal rate and regular rhythm.     Pulses: Normal pulses.     Heart sounds: Normal heart sounds.  Pulmonary:     Effort: Pulmonary effort is normal. No respiratory distress.     Breath sounds: Normal breath sounds.  Abdominal:     General: Bowel sounds are normal. There is no distension.     Palpations: Abdomen  is soft.  Musculoskeletal:        General: Normal range of motion.     Cervical back: Normal range of motion and neck supple.     Right lower leg: No edema.     Left lower leg: No edema.  Lymphadenopathy:     Cervical: No cervical adenopathy.  Skin:    General: Skin is warm and dry.  Neurological:     Mental Status: She is alert and oriented to person, place, and time.     Cranial Nerves: No cranial nerve deficit.  Psychiatric:        Mood and Affect: Mood normal.        Behavior: Behavior normal.        Thought Content: Thought content normal.    ASSESSMENT and PLAN: This visit occurred during the SARS-CoV-2 public health emergency.  Safety protocols were in place, including screening questions prior to the visit, additional usage of staff PPE, and extensive cleaning of exam room while observing appropriate contact time as indicated for disinfecting solutions.   Toni Stafford was seen today for annual exam.  Diagnoses and all orders for this visit:  Encounter for preventative adult health care exam with abnormal findings -     Comprehensive metabolic panel -     CBC with Differential/Platelet -     Lipid panel  Need for diphtheria-tetanus-pertussis (Tdap) vaccine -     Tdap vaccine greater than or equal to 7yo IM  Weight loss, non-intentional -      TSH  Nutrition deficiency due to a particular kind of food -     B12 -     Folate -     IBC + Ferritin -     Vitamin D (25 hydroxy)  Screen for STD (sexually transmitted disease) -     Urine cytology ancillary only([place]) -     HIV antibody (with reflex) -     RPR  Encounter for lipid screening for cardiovascular disease -     Lipid panel      Problem List Items Addressed This Visit       Other   Weight loss, non-intentional    Wt Readings from Last 3 Encounters:  10/10/21 108 lb 6.4 oz (49.2 kg)  10/06/20 118 lb 3.2 oz (53.6 kg)  05/11/20 112 lb 11.2 oz (51.1 kg)  admits to decreased meal intake due to work schedule. She eats 73meal per day. Maintains a pescaterian diet, no diary consumption. Hx of UC, last colonoscopy 2021 (normal per patient). No change in GI function  Encourage to maintain 79meals a day and 2snacks. Provided printed information. Check TSH, B12, iron, folate and vit D. F/up in 36months is no improvement      Relevant Orders   TSH   Other Visit Diagnoses     Encounter for preventative adult health care exam with abnormal findings    -  Primary   Relevant Orders   Comprehensive metabolic panel   CBC with Differential/Platelet   Lipid panel   Need for diphtheria-tetanus-pertussis (Tdap) vaccine       Relevant Orders   Tdap vaccine greater than or equal to 7yo IM   Nutrition deficiency due to a particular kind of food       Relevant Orders   B12   Folate   IBC + Ferritin   Vitamin D (25 hydroxy)   Screen for STD (sexually transmitted disease)       Relevant Orders  Urine cytology ancillary only([place])   HIV antibody (with reflex)   RPR   Encounter for lipid screening for cardiovascular disease       Relevant Orders   Lipid panel       Follow up: Return in about 1 year (around 10/10/2022) for CPE (fasting).  Wilfred Lacy, NP

## 2021-10-11 LAB — IBC + FERRITIN
Ferritin: 71.4 ng/mL (ref 10.0–291.0)
Iron: 75 ug/dL (ref 42–145)
Saturation Ratios: 21.2 % (ref 20.0–50.0)
TIBC: 354.2 ug/dL (ref 250.0–450.0)
Transferrin: 253 mg/dL (ref 212.0–360.0)

## 2021-10-11 LAB — COMPREHENSIVE METABOLIC PANEL
ALT: 20 U/L (ref 0–35)
AST: 28 U/L (ref 0–37)
Albumin: 4.6 g/dL (ref 3.5–5.2)
Alkaline Phosphatase: 54 U/L (ref 39–117)
BUN: 12 mg/dL (ref 6–23)
CO2: 31 mEq/L (ref 19–32)
Calcium: 10 mg/dL (ref 8.4–10.5)
Chloride: 101 mEq/L (ref 96–112)
Creatinine, Ser: 0.71 mg/dL (ref 0.40–1.20)
GFR: 104.92 mL/min (ref >60.00)
Glucose, Bld: 80 mg/dL (ref 70–99)
Potassium: 3.6 mEq/L (ref 3.5–5.1)
Sodium: 137 mEq/L (ref 135–145)
Total Bilirubin: 0.5 mg/dL (ref 0.2–1.2)
Total Protein: 8.2 g/dL (ref 6.0–8.3)

## 2021-10-11 LAB — LIPID PANEL
Cholesterol: 165 mg/dL (ref 0–200)
HDL: 93.9 mg/dL (ref >39.00)
LDL Cholesterol: 63 mg/dL (ref 0–99)
NonHDL: 71.02
Total CHOL/HDL Ratio: 2
Triglycerides: 41 mg/dL (ref 0.0–149.0)
VLDL: 8.2 mg/dL (ref 0.0–40.0)

## 2021-10-11 LAB — URINE CYTOLOGY ANCILLARY ONLY
Chlamydia: NEGATIVE
Comment: NEGATIVE
Comment: NEGATIVE
Comment: NORMAL
Neisseria Gonorrhea: NEGATIVE
Trichomonas: NEGATIVE

## 2021-10-11 LAB — RPR: RPR Ser Ql: NONREACTIVE

## 2021-10-11 LAB — HIV ANTIBODY (ROUTINE TESTING W REFLEX): HIV 1&2 Ab, 4th Generation: NONREACTIVE

## 2021-10-11 NOTE — Addendum Note (Signed)
Addended by: Leana Gamer on: 10/11/2021 04:02 PM   Modules accepted: Orders

## 2021-10-24 DIAGNOSIS — Z419 Encounter for procedure for purposes other than remedying health state, unspecified: Secondary | ICD-10-CM | POA: Diagnosis not present

## 2021-11-24 DIAGNOSIS — Z419 Encounter for procedure for purposes other than remedying health state, unspecified: Secondary | ICD-10-CM | POA: Diagnosis not present

## 2021-12-24 DIAGNOSIS — Z419 Encounter for procedure for purposes other than remedying health state, unspecified: Secondary | ICD-10-CM | POA: Diagnosis not present

## 2022-01-24 DIAGNOSIS — Z419 Encounter for procedure for purposes other than remedying health state, unspecified: Secondary | ICD-10-CM | POA: Diagnosis not present

## 2022-02-23 DIAGNOSIS — Z419 Encounter for procedure for purposes other than remedying health state, unspecified: Secondary | ICD-10-CM | POA: Diagnosis not present

## 2022-02-27 ENCOUNTER — Encounter: Payer: Self-pay | Admitting: Nurse Practitioner

## 2022-02-27 DIAGNOSIS — M0579 Rheumatoid arthritis with rheumatoid factor of multiple sites without organ or systems involvement: Secondary | ICD-10-CM

## 2022-03-21 ENCOUNTER — Encounter: Payer: Self-pay | Admitting: Nurse Practitioner

## 2022-03-26 DIAGNOSIS — Z419 Encounter for procedure for purposes other than remedying health state, unspecified: Secondary | ICD-10-CM | POA: Diagnosis not present

## 2022-04-26 DIAGNOSIS — Z419 Encounter for procedure for purposes other than remedying health state, unspecified: Secondary | ICD-10-CM | POA: Diagnosis not present

## 2022-05-06 ENCOUNTER — Ambulatory Visit (INDEPENDENT_AMBULATORY_CARE_PROVIDER_SITE_OTHER): Payer: Managed Care, Other (non HMO)

## 2022-05-06 ENCOUNTER — Ambulatory Visit
Admission: RE | Admit: 2022-05-06 | Discharge: 2022-05-06 | Disposition: A | Discharge status: Home / Self Care | Payer: Managed Care, Other (non HMO) | Source: Ambulatory Visit | Attending: Emergency Medicine | Admitting: Emergency Medicine

## 2022-05-06 VITALS — BP 138/91 | HR 73 | Temp 98.2°F | Resp 18

## 2022-05-06 DIAGNOSIS — S6992XA Unspecified injury of left wrist, hand and finger(s), initial encounter: Secondary | ICD-10-CM

## 2022-05-06 DIAGNOSIS — M79645 Pain in left finger(s): Secondary | ICD-10-CM | POA: Diagnosis not present

## 2022-05-06 NOTE — ED Provider Notes (Signed)
HPI  SUBJECTIVE:  Toni Stafford is a right-handed 41 y.o. female who presents with pain and swelling at the left MCP joint, left thumb pain after bending her thumb backwards while putting a box on a conveyor at work on 8/25, 2-1/2 weeks ago.  She reports throbbing pain along the proximal phalanx that radiates along the dorsum of the hand to her wrist at night and states that she cannot fully flex at the IP joint.  No numbness, tingling, bruising.  She has tried ice which made her symptoms worse, ibuprofen which partially relieves the pain and Tylenol.  She has a past medical history of rheumatoid arthritis and takes tramadol/Celebrex combination daily, Sjoren's syndrome.  LMP: Has an IUD.  Denies possibility of being pregnant.  PCP: [facility].  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  Past Medical History:  Diagnosis Date   Arthritis    ra no meds taken   Complication of anesthesia    stomach pain after colonscopy 2017, prefers not to use propofol   Hemorrhoids    Ulcerative colitis    none seen on 2017 or 2021 colonscopy no meds taken    Past Surgical History:  Procedure Laterality Date   colonscopy  2017, may 2021   dr Loreta Ave   HEMORRHOID SURGERY N/A 05/11/2020   Procedure: SINGLE COLUMN HEMORRHOIDECTOMY, HEMORRHOIDAL PEXY;  Surgeon: Romie Levee, MD;  Location: [facility];  Service: General;  Laterality: N/A;   KNEE SURGERY Left 1998   TUBAL LIGATION      Family History  Problem Relation Age of Onset   Hypertension Mother     Social History   Tobacco Use   Smoking status: Never   Smokeless tobacco: Never  Vaping Use   Vaping Use: Never used  Substance Use Topics   Alcohol use: Yes    Comment: occ once a month wine   Drug use: Never    No current facility-administered medications for this encounter.  Current Outpatient Medications:    cyanocobalamin (,VITAMIN B-12,) 1000 MCG/ML injection, Inject 1 mL  (1,000 mcg total) into the muscle. weekly x 4weeks, then monthly., Disp: 1 mL, Rfl: 0   levonorgestrel (MIRENA) 20 MCG/24HR IUD, 1 each by Intrauterine route once., Disp: , Rfl:    Multiple Vitamin (MULTIVITAMIN WITH MINERALS) TABS tablet, Take 1 tablet by mouth daily. Liquid 1 table spoon a day, Disp: , Rfl:    Turmeric Curcumin 500 MG CAPS, Take 500 mg by mouth. Takes 3 times per week., Disp: , Rfl:   Allergies  Allergen Reactions   Olive Oil Swelling    hives   Propofol     Prefers not to use due to stomach pain after 2017 colonscopy     ROS  As noted in HPI.   Physical Exam  BP (!) 138/91   Pulse 73   Temp 98.2 F (36.8 C)   Resp 18   SpO2 98%   Constitutional: Well developed, well nourished, no acute distress Eyes:  EOMI, conjunctiva normal bilaterally HENT: Normocephalic, atraumatic,mucus membranes moist Respiratory: Normal inspiratory effort Cardiovascular: Normal rate GI: nondistended skin: No rash, skin intact Musculoskeletal: Mild swelling at the left MCP joint.  Tenderness at the MCP joint, sides of the proximal phalanx.  No tenderness on the palmar surface of the thumb or along the thenar eminence.  Positive tenderness down the dorsal aspect first metacarpal.  No wrist tenderness, wrist pain.  Negative Finkelstein.  Limited flexion at the IP joint.  Patient able to oppose thumb.  Cap refill less than 2 seconds.  Ulnar collateral ligament stable. Neurologic: Alert & oriented x 3, no focal neuro deficits Psychiatric: Speech and behavior appropriate   ED Course   Medications - No data to display  Orders Placed This Encounter  Procedures   DG Finger Thumb Left    Standing Status:   Standing    Number of Occurrences:   1    Order Specific Question:   Reason for Exam (SYMPTOM  OR DIAGNOSIS REQUIRED)    Answer:   Limited ROM and pain x 2 weeks    Order Specific Question:   Release to patient    Answer:   Immediate   Ambulatory referral to Orthopedic  Surgery    Referral Priority:   Routine    Referral Type:   Surgical    Referral Reason:   Specialty Services Required    Referred to Provider:   Yolonda Kida, MD    Requested Specialty:   Orthopedic Surgery    Number of Visits Requested:   1   Thumb spica    Standing Status:   Standing    Number of Occurrences:   1    Order Specific Question:   Laterality    Answer:   Left    No results found for this or any previous visit (from the past 24 hour(s)). DG Finger Thumb Left  Result Date: 05/06/2022 CLINICAL DATA:  Limited range of movement and pain for 2 weeks, left thumb. EXAM: LEFT THUMB 2+V COMPARISON:  None Available. FINDINGS: There is no evidence of fracture or dislocation. There is no evidence of arthropathy or other focal bone abnormality. Soft tissues are unremarkable. IMPRESSION: Negative. Electronically Signed   By: Thornell Sartorius M.D.   On: 05/06/2022 20:11    ED Clinical Impression  1. Injury of left thumb, initial encounter      ED Assessment/Plan     Reviewed imaging independently.  No fracture, dislocation.  See radiology report for full details.  Suspect partial tendon injury/tendinitis of the thumb with limited flexion.  May have an injury to the ulnar collateral ligament although it was fairly stable on my exam.  Her x-ray is negative for fracture, dislocation.  Will place in a thumb spica splint and refer to hand surgery.  She is already taking pain medication for rheumatoid arthritis.  Discussed imaging, MDM, treatment plan, and plan for follow-up with patient.patient agrees with plan.   No orders of the defined types were placed in this encounter.     *This clinic note was created using Dragon dictation software. Therefore, there may be occasional mistakes despite careful proofreading.  ?    Domenick Gong, MD 05/07/22 [phone]

## 2022-05-06 NOTE — ED Triage Notes (Signed)
Pt here with left thumb pain and limited range of motion after jamming it at work at [employer] on a box on 8/25. Her employer kept telling her to wait to seek medical attention.

## 2022-05-06 NOTE — Discharge Instructions (Addendum)
Wear the thumb spica splint at all times.  Continue taking your pain medication.  I have placed a referral to Dr. Aundria Rud at [facility] and have asked them to see you within a week to 10 days.  You can also call them and try and arrange an appointment on your own.

## 2022-05-19 DIAGNOSIS — M79642 Pain in left hand: Secondary | ICD-10-CM | POA: Diagnosis not present

## 2022-05-26 DIAGNOSIS — Z419 Encounter for procedure for purposes other than remedying health state, unspecified: Secondary | ICD-10-CM | POA: Diagnosis not present

## 2022-05-30 DIAGNOSIS — S5332XA Traumatic rupture of left ulnar collateral ligament, initial encounter: Secondary | ICD-10-CM | POA: Diagnosis not present

## 2022-06-13 DIAGNOSIS — M359 Systemic involvement of connective tissue, unspecified: Secondary | ICD-10-CM | POA: Diagnosis not present

## 2022-06-13 DIAGNOSIS — M25511 Pain in right shoulder: Secondary | ICD-10-CM | POA: Diagnosis not present

## 2022-06-13 DIAGNOSIS — Z8739 Personal history of other diseases of the musculoskeletal system and connective tissue: Secondary | ICD-10-CM | POA: Diagnosis not present

## 2022-06-13 DIAGNOSIS — R768 Other specified abnormal immunological findings in serum: Secondary | ICD-10-CM | POA: Diagnosis not present

## 2022-06-13 DIAGNOSIS — Z79899 Other long term (current) drug therapy: Secondary | ICD-10-CM | POA: Diagnosis not present

## 2022-06-13 DIAGNOSIS — M25561 Pain in right knee: Secondary | ICD-10-CM | POA: Diagnosis not present

## 2022-06-13 DIAGNOSIS — M79642 Pain in left hand: Secondary | ICD-10-CM | POA: Diagnosis not present

## 2022-06-26 DIAGNOSIS — Z419 Encounter for procedure for purposes other than remedying health state, unspecified: Secondary | ICD-10-CM | POA: Diagnosis not present

## 2022-07-01 DIAGNOSIS — M25561 Pain in right knee: Secondary | ICD-10-CM | POA: Diagnosis not present

## 2022-07-01 DIAGNOSIS — M359 Systemic involvement of connective tissue, unspecified: Secondary | ICD-10-CM | POA: Diagnosis not present

## 2022-07-01 DIAGNOSIS — M25511 Pain in right shoulder: Secondary | ICD-10-CM | POA: Diagnosis not present

## 2022-07-01 DIAGNOSIS — M79642 Pain in left hand: Secondary | ICD-10-CM | POA: Diagnosis not present

## 2022-07-01 DIAGNOSIS — R768 Other specified abnormal immunological findings in serum: Secondary | ICD-10-CM | POA: Diagnosis not present

## 2022-07-01 DIAGNOSIS — Z8739 Personal history of other diseases of the musculoskeletal system and connective tissue: Secondary | ICD-10-CM | POA: Diagnosis not present

## 2022-07-01 DIAGNOSIS — Z79899 Other long term (current) drug therapy: Secondary | ICD-10-CM | POA: Diagnosis not present

## 2022-07-01 DIAGNOSIS — R471 Dysarthria and anarthria: Secondary | ICD-10-CM | POA: Diagnosis not present

## 2022-07-26 DIAGNOSIS — Z419 Encounter for procedure for purposes other than remedying health state, unspecified: Secondary | ICD-10-CM | POA: Diagnosis not present

## 2022-08-26 DIAGNOSIS — Z419 Encounter for procedure for purposes other than remedying health state, unspecified: Secondary | ICD-10-CM | POA: Diagnosis not present

## 2022-09-17 DIAGNOSIS — L4050 Arthropathic psoriasis, unspecified: Secondary | ICD-10-CM | POA: Diagnosis not present

## 2022-09-17 DIAGNOSIS — Z79899 Other long term (current) drug therapy: Secondary | ICD-10-CM | POA: Diagnosis not present

## 2022-09-17 DIAGNOSIS — R471 Dysarthria and anarthria: Secondary | ICD-10-CM | POA: Diagnosis not present

## 2022-09-17 DIAGNOSIS — M359 Systemic involvement of connective tissue, unspecified: Secondary | ICD-10-CM | POA: Diagnosis not present

## 2022-09-17 DIAGNOSIS — Z8739 Personal history of other diseases of the musculoskeletal system and connective tissue: Secondary | ICD-10-CM | POA: Diagnosis not present

## 2022-09-17 DIAGNOSIS — M25511 Pain in right shoulder: Secondary | ICD-10-CM | POA: Diagnosis not present

## 2022-09-17 DIAGNOSIS — M79642 Pain in left hand: Secondary | ICD-10-CM | POA: Diagnosis not present

## 2022-09-17 DIAGNOSIS — R768 Other specified abnormal immunological findings in serum: Secondary | ICD-10-CM | POA: Diagnosis not present

## 2022-09-17 DIAGNOSIS — M25561 Pain in right knee: Secondary | ICD-10-CM | POA: Diagnosis not present

## 2022-09-26 DIAGNOSIS — Z419 Encounter for procedure for purposes other than remedying health state, unspecified: Secondary | ICD-10-CM | POA: Diagnosis not present

## 2022-10-11 DIAGNOSIS — K625 Hemorrhage of anus and rectum: Secondary | ICD-10-CM

## 2022-10-11 DIAGNOSIS — R194 Change in bowel habit: Secondary | ICD-10-CM | POA: Insufficient documentation

## 2022-10-11 DIAGNOSIS — Z8719 Personal history of other diseases of the digestive system: Secondary | ICD-10-CM | POA: Insufficient documentation

## 2022-10-11 DIAGNOSIS — K642 Third degree hemorrhoids: Secondary | ICD-10-CM | POA: Insufficient documentation

## 2022-10-11 DIAGNOSIS — K52832 Lymphocytic colitis: Secondary | ICD-10-CM | POA: Insufficient documentation

## 2022-10-11 DIAGNOSIS — K219 Gastro-esophageal reflux disease without esophagitis: Secondary | ICD-10-CM | POA: Insufficient documentation

## 2022-10-11 DIAGNOSIS — R1013 Epigastric pain: Secondary | ICD-10-CM | POA: Insufficient documentation

## 2022-10-11 DIAGNOSIS — Z1211 Encounter for screening for malignant neoplasm of colon: Secondary | ICD-10-CM | POA: Insufficient documentation

## 2022-10-11 HISTORY — DX: Hemorrhage of anus and rectum: K62.5

## 2022-10-14 ENCOUNTER — Ambulatory Visit (INDEPENDENT_AMBULATORY_CARE_PROVIDER_SITE_OTHER): Payer: Managed Care, Other (non HMO) | Admitting: Nurse Practitioner

## 2022-10-14 ENCOUNTER — Encounter: Payer: Self-pay | Admitting: Nurse Practitioner

## 2022-10-14 VITALS — BP 128/72 | HR 72 | Temp 98.1°F | Resp 16 | Ht 65.5 in | Wt 118.0 lb

## 2022-10-14 DIAGNOSIS — M0579 Rheumatoid arthritis with rheumatoid factor of multiple sites without organ or systems involvement: Secondary | ICD-10-CM | POA: Diagnosis not present

## 2022-10-14 DIAGNOSIS — Z7952 Long term (current) use of systemic steroids: Secondary | ICD-10-CM | POA: Diagnosis not present

## 2022-10-14 DIAGNOSIS — Z0001 Encounter for general adult medical examination with abnormal findings: Secondary | ICD-10-CM

## 2022-10-14 DIAGNOSIS — E538 Deficiency of other specified B group vitamins: Secondary | ICD-10-CM

## 2022-10-14 DIAGNOSIS — D849 Immunodeficiency, unspecified: Secondary | ICD-10-CM

## 2022-10-14 DIAGNOSIS — Z0000 Encounter for general adult medical examination without abnormal findings: Secondary | ICD-10-CM

## 2022-10-14 DIAGNOSIS — D500 Iron deficiency anemia secondary to blood loss (chronic): Secondary | ICD-10-CM | POA: Diagnosis not present

## 2022-10-14 DIAGNOSIS — J0140 Acute pansinusitis, unspecified: Secondary | ICD-10-CM

## 2022-10-14 DIAGNOSIS — K5190 Ulcerative colitis, unspecified, without complications: Secondary | ICD-10-CM | POA: Diagnosis not present

## 2022-10-14 LAB — COMPREHENSIVE METABOLIC PANEL
ALT: 19 U/L (ref 0–35)
AST: 18 U/L (ref 0–37)
Albumin: 4.1 g/dL (ref 3.5–5.2)
Alkaline Phosphatase: 45 U/L (ref 39–117)
BUN: 7 mg/dL (ref 6–23)
CO2: 26 mEq/L (ref 19–32)
Calcium: 9.3 mg/dL (ref 8.4–10.5)
Chloride: 103 mEq/L (ref 96–112)
Creatinine, Ser: 0.63 mg/dL (ref 0.40–1.20)
GFR: 108.7 mL/min (ref >60.00)
Glucose, Bld: 83 mg/dL (ref 70–99)
Potassium: 3.3 mEq/L — ABNORMAL LOW (ref 3.5–5.1)
Sodium: 138 mEq/L (ref 135–145)
Total Bilirubin: 0.5 mg/dL (ref 0.2–1.2)
Total Protein: 7.1 g/dL (ref 6.0–8.3)

## 2022-10-14 LAB — VITAMIN B12: Vitamin B-12: >1500 pg/mL — ABNORMAL HIGH (ref 211–911)

## 2022-10-14 LAB — HEMOGLOBIN A1C: Hgb A1c MFr Bld: 5.7 % (ref 4.6–6.5)

## 2022-10-14 MED ORDER — AMOXICILLIN-POT CLAVULANATE 875-125 MG PO TABS: 1.0000 | ORAL_TABLET | Freq: Two times a day (BID) | ORAL | 0 refills | Status: DC

## 2022-10-14 NOTE — Assessment & Plan Note (Signed)
Daily fatigue, no paresthesia or change in taste Repeat B12 Current use of oral supplement.

## 2022-10-14 NOTE — Assessment & Plan Note (Signed)
Followed by Dr. Collene Mares, last colonoscopy 2022 Denies any GI bleed or diarrhea Stable weight. Wt Readings from Last 3 Encounters:  10/14/22 118 lb (53.5 kg)  10/10/21 108 lb 6.4 oz (49.2 kg)  10/06/20 118 lb 3.2 oz (53.6 kg)

## 2022-10-14 NOTE — Assessment & Plan Note (Signed)
Low H/H and WBC per cbc completed by Dr. Dossie Der No GI bleed Repeat iron panel today

## 2022-10-14 NOTE — Patient Instructions (Signed)
Go to lab Continue Heart healthy diet and daily exercise. Call office if headache and sinus congestion does not improve in 10days

## 2022-10-14 NOTE — Assessment & Plan Note (Signed)
>>  ASSESSMENT AND PLAN FOR CHRONIC ULCERATIVE COLITIS ([facility]) WRITTEN ON 10/14/2022 12:36 PM BY Romanita Fager LUM, NP  Followed by Dr. Loreta Ave, last colonoscopy 2022 Denies any GI bleed or diarrhea Stable weight. Wt Readings from Last 3 Encounters:  10/14/22 118 lb (53.5 kg)  10/10/21 108 lb 6.4 oz (49.2 kg)  10/06/20 118 lb 3.2 oz (53.6 kg)

## 2022-10-14 NOTE — Progress Notes (Signed)
Complete physical exam  Patient: Toni Stafford   DOB: 04/16/1981   42 y.o. Female  MRN: [id number] Visit Date: 10/14/2022  Subjective:    Chief Complaint  Patient presents with   Annual Exam    CPE- Fasting- Declines flu shot    Toni Stafford is a 42 y.o. female who presents today for a complete physical exam. She reports consuming a general diet.  No regular exercise regimen.  She generally feels well. She reports sleeping well. She does not have additional problems to discuss today.  Vision:Yes Dental:Yes STD Screen:No  Upcoming appt With GYN 12/2022. PAP and mammogram will be completed BP Readings from Last 3 Encounters:  10/14/22 128/72  05/06/22 (!) 138/91  10/10/21 104/64   Wt Readings from Last 3 Encounters:  10/14/22 118 lb (53.5 kg)  10/10/21 108 lb 6.4 oz (49.2 kg)  10/06/20 118 lb 3.2 oz (53.6 kg)   Most recent fall risk assessment:    10/14/2022    8:42 AM  Fall Risk   Falls in the past year? 0  Number falls in past yr: 0  Injury with Fall? 0  Risk for fall due to : No Fall Risks  Follow up Falls evaluation completed   Depression screen:Yes - No Depression  Most recent depression screenings:    10/14/2022    8:42 AM 10/10/2021    8:26 AM  PHQ 2/9 Scores  PHQ - 2 Score 0 0    HPI  Chronic ulcerative colitis ([place]) Followed by Dr. Collene Mares, last colonoscopy 2022 Denies any GI bleed or diarrhea Stable weight. Wt Readings from Last 3 Encounters:  10/14/22 118 lb (53.5 kg)  10/10/21 108 lb 6.4 oz (49.2 kg)  10/06/20 118 lb 3.2 oz (53.6 kg)     Rheumatoid arthritis ([place]) Followed by Dr. Dossie Der, current use of methotrexate, prednisone and plaquenil,  reviewed labs completed 06/2022 and 08/2022 on her mobile device: (hep. B, Hep C, Quantiferon, CBC, CMP: normal except persistent low WBC).  B12 deficiency Daily fatigue, no paresthesia or change in taste Repeat B12 Current use of oral supplement.  Iron deficiency anemia due to chronic blood  loss Low H/H and WBC per cbc completed by Dr. Dossie Der No GI bleed Repeat iron panel today   Past Medical History:  Diagnosis Date   Arthritis    ra no meds taken   Complication of anesthesia    stomach pain after colonscopy 2017, prefers not to use propofol   Hemorrhoids    Ulcerative colitis    none seen on 2017 or 2021 colonscopy no meds taken   Past Surgical History:  Procedure Laterality Date   colonscopy  2017, may 2021   dr Collene Mares   HEMORRHOID SURGERY N/A 05/11/2020   Procedure: SINGLE COLUMN HEMORRHOIDECTOMY, HEMORRHOIDAL PEXY;  Surgeon: Leighton Ruff, MD;  Location: [facility];  Service: General;  Laterality: N/A;   KNEE SURGERY Left 1998   TUBAL LIGATION     Social History   Socioeconomic History   Marital status: Single    Spouse name: Not on file   Number of children: Not on file   Years of education: Not on file   Highest education level: Not on file  Occupational History   Not on file  Tobacco Use   Smoking status: Never   Smokeless tobacco: Never  Vaping Use   Vaping Use: Never used  Substance and Sexual Activity   Alcohol use: Yes    Comment: occ once a month  wine   Drug use: Never   Sexual activity: Not on file  Other Topics Concern   Not on file  Social History Narrative   Not on file   Social Determinants of Health   Financial Resource Strain: Not on file  Food Insecurity: Not on file  Transportation Needs: Not on file  Physical Activity: Not on file  Stress: Not on file  Social Connections: Not on file  Intimate Partner Violence: Not on file   Family Status  Relation Name Status   Mother  (Not Specified)   Family History  Problem Relation Age of Onset   Hypertension Mother    Allergies  Allergen Reactions   Olive Oil Swelling    hives   Propofol     Prefers not to use due to stomach pain after 2017 colonscopy    Patient Care Team: Jessah Danser, Charlene Brooke, NP as PCP - General (Internal Medicine) Bo Merino,  MD as Consulting Physician (Rheumatology)   Medications: Outpatient Medications Prior to Visit  Medication Sig   folic acid (FOLVITE) 1 MG tablet Take 1 mg by mouth daily.   hydroxychloroquine (PLAQUENIL) 200 MG tablet Take by mouth.   levonorgestrel (MIRENA) 20 MCG/24HR IUD 1 each by Intrauterine route once.   meloxicam (MOBIC) 15 MG tablet Take 15 mg by mouth 3 (three) times daily.   methotrexate (RHEUMATREX) 2.5 MG tablet Take 10 mg by mouth once a week.   Multiple Vitamin (MULTIVITAMIN WITH MINERALS) TABS tablet Take 1 tablet by mouth daily. Liquid 1 table spoon a day   predniSONE (DELTASONE) 10 MG tablet Take 10 mg by mouth daily.   Turmeric Curcumin 500 MG CAPS Take 500 mg by mouth. Takes 3 times per week.   cyanocobalamin (,VITAMIN B-12,) 1000 MCG/ML injection Inject 1 mL (1,000 mcg total) into the muscle. 1073mg weekly x 4weeks, then monthly. (Patient not taking: Reported on 10/14/2022)   No facility-administered medications prior to visit.    Review of Systems  Constitutional:  Negative for activity change, appetite change and unexpected weight change.  HENT:  Positive for congestion, postnasal drip, rhinorrhea, sinus pressure and sinus pain.   Respiratory: Negative.    Cardiovascular: Negative.   Gastrointestinal: Negative.   Endocrine: Negative for cold intolerance and heat intolerance.  Genitourinary: Negative.   Musculoskeletal: Negative.   Skin: Negative.   Neurological:  Positive for headaches. Negative for dizziness, facial asymmetry and light-headedness.  Hematological: Negative.   Psychiatric/Behavioral:  Negative for behavioral problems, decreased concentration, dysphoric mood, hallucinations, self-injury, sleep disturbance and suicidal ideas. The patient is not nervous/anxious.       Objective:  BP 128/72 (BP Location: Left Arm, Patient Position: Sitting, Cuff Size: Normal)   Pulse 72   Temp 98.1 F (36.7 C) (Temporal)   Resp 16   Ht 5' 5.5" (1.664 m)  Comment: Checked patient's height at lab per her request  Wt 118 lb (53.5 kg)   SpO2 99%   BMI 19.34 kg/m     Physical Exam Vitals and nursing note reviewed.  Constitutional:      General: She is not in acute distress. HENT:     Head: Normocephalic.     Right Ear: Tympanic membrane, ear canal and external ear normal.     Left Ear: Tympanic membrane, ear canal and external ear normal.     Nose: Nasal tenderness, mucosal edema, congestion and rhinorrhea present.     Right Nostril: Occlusion present.     Left Nostril: Occlusion present.  Right Turbinates: Enlarged and swollen.     Left Turbinates: Enlarged and swollen.     Right Sinus: Maxillary sinus tenderness and frontal sinus tenderness present.     Left Sinus: Maxillary sinus tenderness and frontal sinus tenderness present.  Eyes:     Extraocular Movements: Extraocular movements intact.     Conjunctiva/sclera: Conjunctivae normal.     Pupils: Pupils are equal, round, and reactive to light.  Neck:     Thyroid: No thyroid mass, thyromegaly or thyroid tenderness.  Cardiovascular:     Rate and Rhythm: Normal rate and regular rhythm.     Pulses: Normal pulses.     Heart sounds: Normal heart sounds.  Pulmonary:     Effort: Pulmonary effort is normal.     Breath sounds: Normal breath sounds.  Abdominal:     General: Bowel sounds are normal.     Palpations: Abdomen is soft.  Genitourinary:    Comments: Deferred breast and pelvic exam to GYN Musculoskeletal:        General: Normal range of motion.     Cervical back: Normal range of motion and neck supple.     Right lower leg: No edema.     Left lower leg: No edema.  Lymphadenopathy:     Cervical: No cervical adenopathy.  Skin:    General: Skin is warm and dry.  Neurological:     Mental Status: She is alert and oriented to person, place, and time.     Cranial Nerves: No cranial nerve deficit.  Psychiatric:        Mood and Affect: Mood normal.        Behavior: Behavior  normal.        Thought Content: Thought content normal.     No results found for any visits on 10/14/22.    Assessment & Plan:    Routine Health Maintenance and Physical Exam  Immunization History  Administered Date(s) Administered   Moderna Sars-Covid-2 Vaccination 11/09/2019, 12/07/2019   PPD Test 10/24/2020   Tdap 10/10/2021   Health Maintenance  Topic Date Due   COVID-19 Vaccine (3 - Moderna risk series) 01/04/2020   INFLUENZA VACCINE  11/24/2022 (Originally 03/26/2022)   PAP SMEAR-Modifier  12/14/2023   DTaP/Tdap/Td (2 - Td or Tdap) 10/11/2031   Hepatitis C Screening  Completed   HIV Screening  Completed   HPV VACCINES  Aged Out   Discussed health benefits of physical activity, and encouraged her to engage in regular exercise appropriate for her age and condition.  Problem List Items Addressed This Visit       Digestive   Chronic ulcerative colitis ([place])    Followed by Dr. Collene Mares, last colonoscopy 2022 Denies any GI bleed or diarrhea Stable weight. Wt Readings from Last 3 Encounters:  10/14/22 118 lb (53.5 kg)  10/10/21 108 lb 6.4 oz (49.2 kg)  10/06/20 118 lb 3.2 oz (53.6 kg)           Musculoskeletal and Integument   Rheumatoid arthritis ([place])    Followed by Dr. Dossie Der, current use of methotrexate, prednisone and plaquenil,  reviewed labs completed 06/2022 and 08/2022 on her mobile device: (hep. B, Hep C, Quantiferon, CBC, CMP: normal except persistent low WBC).      Relevant Medications   methotrexate (RHEUMATREX) 2.5 MG tablet   hydroxychloroquine (PLAQUENIL) 200 MG tablet   predniSONE (DELTASONE) 10 MG tablet   meloxicam (MOBIC) 15 MG tablet     Other   B12 deficiency  Daily fatigue, no paresthesia or change in taste Repeat B12 Current use of oral supplement.      Relevant Orders   B12   Immunocompromised patient ([place])   Relevant Orders   Hemoglobin A1c   Iron deficiency anemia due to chronic blood loss    Low H/H and WBC per cbc completed by  Dr. Dossie Der No GI bleed Repeat iron panel today      Relevant Medications   folic acid (FOLVITE) 1 MG tablet   Other Relevant Orders   Iron, TIBC and Ferritin Panel   Long-term corticosteroid use   Relevant Orders   Hemoglobin A1c   Other Visit Diagnoses     Encounter for preventative adult health care exam with abnormal findings    -  Primary   Relevant Orders   Comprehensive metabolic panel   Acute non-recurrent pansinusitis       Relevant Medications   hydroxychloroquine (PLAQUENIL) 200 MG tablet   predniSONE (DELTASONE) 10 MG tablet   amoxicillin-clavulanate (AUGMENTIN) 875-125 MG tablet      Return in about 1 year (around 10/15/2023) for CPE (fasting).     Wilfred Lacy, NP

## 2022-10-14 NOTE — Assessment & Plan Note (Addendum)
Followed by Dr. Dossie Der, current use of methotrexate, prednisone and plaquenil,  reviewed labs completed 06/2022 and 08/2022 on her mobile device: (hep. B, Hep C, Quantiferon, CBC, CMP: normal except persistent low WBC).

## 2022-10-15 LAB — IRON,TIBC AND FERRITIN PANEL
%SAT: 33 % (calc) (ref 16–45)
Ferritin: 61 ng/mL (ref 16–232)
Iron: 106 ug/dL (ref 40–190)
TIBC: 326 mcg/dL (calc) (ref 250–450)

## 2022-10-15 NOTE — Progress Notes (Signed)
Normal B12: take oral supplement 2x/week Normal iron panel and HGbA1c Normal renal and liver function, except mild decline in potassium. Need to eat high potassium foods like potatoes, green leafy vegetables, avocado, banana, broccoli etc

## 2022-10-22 DIAGNOSIS — Z8739 Personal history of other diseases of the musculoskeletal system and connective tissue: Secondary | ICD-10-CM | POA: Diagnosis not present

## 2022-10-22 DIAGNOSIS — M79642 Pain in left hand: Secondary | ICD-10-CM | POA: Diagnosis not present

## 2022-10-22 DIAGNOSIS — M359 Systemic involvement of connective tissue, unspecified: Secondary | ICD-10-CM | POA: Diagnosis not present

## 2022-10-22 DIAGNOSIS — R471 Dysarthria and anarthria: Secondary | ICD-10-CM | POA: Diagnosis not present

## 2022-10-22 DIAGNOSIS — R768 Other specified abnormal immunological findings in serum: Secondary | ICD-10-CM | POA: Diagnosis not present

## 2022-10-22 DIAGNOSIS — M25561 Pain in right knee: Secondary | ICD-10-CM | POA: Diagnosis not present

## 2022-10-22 DIAGNOSIS — Z79899 Other long term (current) drug therapy: Secondary | ICD-10-CM | POA: Diagnosis not present

## 2022-10-22 DIAGNOSIS — L4050 Arthropathic psoriasis, unspecified: Secondary | ICD-10-CM | POA: Diagnosis not present

## 2022-10-22 DIAGNOSIS — M25511 Pain in right shoulder: Secondary | ICD-10-CM | POA: Diagnosis not present

## 2022-10-25 DIAGNOSIS — Z419 Encounter for procedure for purposes other than remedying health state, unspecified: Secondary | ICD-10-CM | POA: Diagnosis not present

## 2022-11-25 DIAGNOSIS — Z419 Encounter for procedure for purposes other than remedying health state, unspecified: Secondary | ICD-10-CM | POA: Diagnosis not present

## 2022-12-25 DIAGNOSIS — Z419 Encounter for procedure for purposes other than remedying health state, unspecified: Secondary | ICD-10-CM | POA: Diagnosis not present

## 2022-12-31 LAB — HM PAP SMEAR: HM Pap smear: NORMAL

## 2022-12-31 LAB — HM MAMMOGRAPHY

## 2023-01-25 DIAGNOSIS — Z419 Encounter for procedure for purposes other than remedying health state, unspecified: Secondary | ICD-10-CM | POA: Diagnosis not present

## 2023-02-24 DIAGNOSIS — Z419 Encounter for procedure for purposes other than remedying health state, unspecified: Secondary | ICD-10-CM | POA: Diagnosis not present

## 2023-03-27 DIAGNOSIS — Z419 Encounter for procedure for purposes other than remedying health state, unspecified: Secondary | ICD-10-CM | POA: Diagnosis not present

## 2023-04-27 DIAGNOSIS — Z419 Encounter for procedure for purposes other than remedying health state, unspecified: Secondary | ICD-10-CM | POA: Diagnosis not present

## 2023-05-27 DIAGNOSIS — Z419 Encounter for procedure for purposes other than remedying health state, unspecified: Secondary | ICD-10-CM | POA: Diagnosis not present

## 2023-06-27 DIAGNOSIS — Z419 Encounter for procedure for purposes other than remedying health state, unspecified: Secondary | ICD-10-CM | POA: Diagnosis not present

## 2023-07-27 DIAGNOSIS — Z419 Encounter for procedure for purposes other than remedying health state, unspecified: Secondary | ICD-10-CM | POA: Diagnosis not present

## 2023-08-27 DIAGNOSIS — Z419 Encounter for procedure for purposes other than remedying health state, unspecified: Secondary | ICD-10-CM | POA: Diagnosis not present

## 2023-09-27 DIAGNOSIS — Z419 Encounter for procedure for purposes other than remedying health state, unspecified: Secondary | ICD-10-CM | POA: Diagnosis not present

## 2023-10-16 ENCOUNTER — Encounter: Payer: Managed Care, Other (non HMO) | Admitting: Nurse Practitioner

## 2023-10-17 ENCOUNTER — Encounter: Payer: Self-pay | Admitting: Nurse Practitioner

## 2023-10-17 ENCOUNTER — Ambulatory Visit (INDEPENDENT_AMBULATORY_CARE_PROVIDER_SITE_OTHER): Payer: Managed Care, Other (non HMO) | Admitting: Nurse Practitioner

## 2023-10-17 ENCOUNTER — Other Ambulatory Visit (HOSPITAL_COMMUNITY)
Admission: RE | Admit: 2023-10-17 | Discharge: 2023-10-17 | Disposition: A | Discharge status: Home / Self Care | Payer: Managed Care, Other (non HMO) | Source: Ambulatory Visit | Attending: Nurse Practitioner | Admitting: Nurse Practitioner

## 2023-10-17 VITALS — BP 114/68 | HR 63 | Temp 98.0°F | Ht 65.5 in | Wt 125.6 lb

## 2023-10-17 DIAGNOSIS — Z113 Encounter for screening for infections with a predominantly sexual mode of transmission: Secondary | ICD-10-CM | POA: Diagnosis not present

## 2023-10-17 DIAGNOSIS — D500 Iron deficiency anemia secondary to blood loss (chronic): Secondary | ICD-10-CM | POA: Diagnosis not present

## 2023-10-17 DIAGNOSIS — R7301 Impaired fasting glucose: Secondary | ICD-10-CM

## 2023-10-17 DIAGNOSIS — Z1159 Encounter for screening for other viral diseases: Secondary | ICD-10-CM

## 2023-10-17 DIAGNOSIS — Z13220 Encounter for screening for lipoid disorders: Secondary | ICD-10-CM | POA: Diagnosis not present

## 2023-10-17 DIAGNOSIS — M0579 Rheumatoid arthritis with rheumatoid factor of multiple sites without organ or systems involvement: Secondary | ICD-10-CM

## 2023-10-17 DIAGNOSIS — Z0000 Encounter for general adult medical examination without abnormal findings: Secondary | ICD-10-CM

## 2023-10-17 DIAGNOSIS — Z136 Encounter for screening for cardiovascular disorders: Secondary | ICD-10-CM

## 2023-10-17 DIAGNOSIS — K5190 Ulcerative colitis, unspecified, without complications: Secondary | ICD-10-CM

## 2023-10-17 LAB — LIPID PANEL
Cholesterol: 159 mg/dL (ref 0–200)
HDL: 84.7 mg/dL (ref >39.00)
LDL Cholesterol: 67 mg/dL (ref 0–99)
NonHDL: 74.2
Total CHOL/HDL Ratio: 2
Triglycerides: 35 mg/dL (ref 0.0–149.0)
VLDL: 7 mg/dL (ref 0.0–40.0)

## 2023-10-17 LAB — CBC
HCT: 35.6 % — ABNORMAL LOW (ref 36.0–46.0)
Hemoglobin: 11.9 g/dL — ABNORMAL LOW (ref 12.0–15.0)
MCHC: 33.4 g/dL (ref 30.0–36.0)
MCV: 88.9 fL (ref 78.0–100.0)
Platelets: 205 10*3/uL (ref 150.0–400.0)
RBC: 4 Mil/uL (ref 3.87–5.11)
RDW: 14.4 % (ref 11.5–15.5)
WBC: 4.4 10*3/uL (ref 4.0–10.5)

## 2023-10-17 LAB — COMPREHENSIVE METABOLIC PANEL
ALT: 17 U/L (ref 0–35)
AST: 22 U/L (ref 0–37)
Albumin: 4.4 g/dL (ref 3.5–5.2)
Alkaline Phosphatase: 47 U/L (ref 39–117)
BUN: 7 mg/dL (ref 6–23)
CO2: 25 meq/L (ref 19–32)
Calcium: 8.8 mg/dL (ref 8.4–10.5)
Chloride: 103 meq/L (ref 96–112)
Creatinine, Ser: 0.61 mg/dL (ref 0.40–1.20)
GFR: 108.78 mL/min (ref >60.00)
Glucose, Bld: 93 mg/dL (ref 70–99)
Potassium: 3.8 meq/L (ref 3.5–5.1)
Sodium: 136 meq/L (ref 135–145)
Total Bilirubin: 0.5 mg/dL (ref 0.2–1.2)
Total Protein: 7.7 g/dL (ref 6.0–8.3)

## 2023-10-17 LAB — HEMOGLOBIN A1C: Hgb A1c MFr Bld: 5.7 % (ref 4.6–6.5)

## 2023-10-17 NOTE — Assessment & Plan Note (Signed)
>>  ASSESSMENT AND PLAN FOR CHRONIC ULCERATIVE COLITIS ([facility]) WRITTEN ON 10/17/2023 12:00 PM BY Keyron Pokorski LUM, NP  Followed by Dr. Loreta Ave, last colonoscopy 2022 Denies any GI bleed or diarrhea Stable weight. Wt Readings from Last 3 Encounters:  10/17/23 125 lb 9.6 oz (57 kg)  10/14/22 118 lb (53.5 kg)  10/10/21 108 lb 6.4 oz (49.2 kg)

## 2023-10-17 NOTE — Patient Instructions (Addendum)
 Go to lab Maintain Heart healthy diet and daily exercise. Maintain current medications.  Preventive Care 45-45 Years Old, Female Preventive care refers to lifestyle choices and visits with your health care provider that can promote health and wellness. Preventive care visits are also called wellness exams. What can I expect for my preventive care visit? Counseling Your health care provider may ask you questions about your: Medical history, including: Past medical problems. Family medical history. Pregnancy history. Current health, including: Menstrual cycle. Method of birth control. Emotional well-being. Home life and relationship well-being. Sexual activity and sexual health. Lifestyle, including: Alcohol, nicotine or tobacco, and drug use. Access to firearms. Diet, exercise, and sleep habits. Work and work Astronomer. Sunscreen use. Safety issues such as seatbelt and bike helmet use. Physical exam Your health care provider will check your: Height and weight. These may be used to calculate your BMI (body mass index). BMI is a measurement that tells if you are at a healthy weight. Waist circumference. This measures the distance around your waistline. This measurement also tells if you are at a healthy weight and may help predict your risk of certain diseases, such as type 2 diabetes and high blood pressure. Heart rate and blood pressure. Body temperature. Skin for abnormal spots. What immunizations do I need?  Vaccines are usually given at various ages, according to a schedule. Your health care provider will recommend vaccines for you based on your age, medical history, and lifestyle or other factors, such as travel or where you work. What tests do I need? Screening Your health care provider may recommend screening tests for certain conditions. This may include: Lipid and cholesterol levels. Diabetes screening. This is done by checking your blood sugar (glucose) after you  have not eaten for a while (fasting). Pelvic exam and Pap test. Hepatitis B test. Hepatitis C test. HIV (human immunodeficiency virus) test. STI (sexually transmitted infection) testing, if you are at risk. Lung cancer screening. Colorectal cancer screening. Mammogram. Talk with your health care provider about when you should start having regular mammograms. This may depend on whether you have a family history of breast cancer. BRCA-related cancer screening. This may be done if you have a family history of breast, ovarian, tubal, or peritoneal cancers. Bone density scan. This is done to screen for osteoporosis. Talk with your health care provider about your test results, treatment options, and if necessary, the need for more tests. Follow these instructions at home: Eating and drinking  Eat a diet that includes fresh fruits and vegetables, whole grains, lean protein, and low-fat dairy products. Take vitamin and mineral supplements as recommended by your health care provider. Do not drink alcohol if: Your health care provider tells you not to drink. You are pregnant, may be pregnant, or are planning to become pregnant. If you drink alcohol: Limit how much you have to 0-1 drink a day. Know how much alcohol is in your drink. In the [place] one drink equals one 12 oz bottle of beer (355 mL), one 5 oz glass of wine (148 mL), or one 1 oz glass of hard liquor (44 mL). Lifestyle Brush your teeth every morning and night with fluoride toothpaste. Floss one time each day. Exercise for at least 30 minutes 5 or more days each week. Do not use any products that contain nicotine or tobacco. These products include cigarettes, chewing tobacco, and vaping devices, such as e-cigarettes. If you need help quitting, ask your health care provider. Do not use drugs. If you are  sexually active, practice safe sex. Use a condom or other form of protection to prevent STIs. If you do not wish to become pregnant, use  a form of birth control. If you plan to become pregnant, see your health care provider for a prepregnancy visit. Take aspirin only as told by your health care provider. Make sure that you understand how much to take and what form to take. Work with your health care provider to find out whether it is safe and beneficial for you to take aspirin daily. Find healthy ways to manage stress, such as: Meditation, yoga, or listening to music. Journaling. Talking to a trusted person. Spending time with friends and family. Minimize exposure to UV radiation to reduce your risk of skin cancer. Safety Always wear your seat belt while driving or riding in a vehicle. Do not drive: If you have been drinking alcohol. Do not ride with someone who has been drinking. When you are tired or distracted. While texting. If you have been using any mind-altering substances or drugs. Wear a helmet and other protective equipment during sports activities. If you have firearms in your house, make sure you follow all gun safety procedures. Seek help if you have been physically or sexually abused. What's next? Visit your health care provider once a year for an annual wellness visit. Ask your health care provider how often you should have your eyes and teeth checked. Stay up to date on all vaccines. This information is not intended to replace advice given to you by your health care provider. Make sure you discuss any questions you have with your health care provider. Document Revised: 02/07/2021 Document Reviewed: 02/07/2021 Elsevier Patient Education  2024 [employer].

## 2023-10-17 NOTE — Assessment & Plan Note (Signed)
 Repeat cbc

## 2023-10-17 NOTE — Assessment & Plan Note (Signed)
Followed by Dr. Kathi Ludwig,  methotrexate, prednisone and plaquenil were discontinued Current use of mobic only

## 2023-10-17 NOTE — Assessment & Plan Note (Signed)
Followed by Dr. Loreta Ave, last colonoscopy 2022 Denies any GI bleed or diarrhea Stable weight. Wt Readings from Last 3 Encounters:  10/17/23 125 lb 9.6 oz (57 kg)  10/14/22 118 lb (53.5 kg)  10/10/21 108 lb 6.4 oz (49.2 kg)

## 2023-10-17 NOTE — Progress Notes (Signed)
Complete physical exam  Patient: Toni Stafford   DOB: 06/20/1981   43 y.o. Female  MRN: [id number] Visit Date: 10/17/2023  Subjective:    Chief Complaint  Patient presents with   Annual Exam    Fasting   Toni Stafford is a 43 y.o. female who presents today for a complete physical exam. She reports consuming a low fat and low sodium diet. Home exercise routine includes calisthenics, exercise ball, and stretching. She generally feels well. She reports sleeping well. She does not have additional problems to discuss today.  Vision:Yes Dental:Yes STD Screen:Yes  BP Readings from Last 3 Encounters:  10/17/23 114/68  10/14/22 128/72  05/06/22 (!) 138/91   Wt Readings from Last 3 Encounters:  10/17/23 125 lb 9.6 oz (57 kg)  10/14/22 118 lb (53.5 kg)  10/10/21 108 lb 6.4 oz (49.2 kg)   Most recent fall risk assessment:    10/17/2023   10:09 AM  Fall Risk   Falls in the past year? 0  Number falls in past yr: 0  Injury with Fall? 0  Risk for fall due to : No Fall Risks  Follow up Falls evaluation completed   Depression screen:Yes - No Depression  Most recent depression screenings:    10/17/2023   10:09 AM 10/14/2022    8:42 AM  PHQ 2/9 Scores  PHQ - 2 Score 0 0  PHQ- 9 Score 1    HPI  Lymphocytic colitis  >>ASSESSMENT AND PLAN FOR CHRONIC ULCERATIVE COLITIS (HCC) WRITTEN ON 10/17/2023 12:00 PM BY Evanne Matsunaga LUM, NP  Followed by Dr. Loreta Ave, last colonoscopy 2022 Denies any GI bleed or diarrhea Stable weight. Wt Readings from Last 3 Encounters:  10/17/23 125 lb 9.6 oz (57 kg)  10/14/22 118 lb (53.5 kg)  10/10/21 108 lb 6.4 oz (49.2 kg)     Rheumatoid arthritis (HCC) Followed by Dr. Kathi Ludwig,  methotrexate, prednisone and plaquenil were discontinued Current use of mobic only  Iron deficiency anemia due to chronic blood loss Repeat cbc  Past Medical History:  Diagnosis Date   Arthritis    ra no meds taken   Complication of anesthesia    stomach pain  after colonscopy 2017, prefers not to use propofol   Hemorrhoids    Rectal bleeding 10/11/2022   Ulcerative colitis    none seen on 2017 or 2021 colonscopy no meds taken   Past Surgical History:  Procedure Laterality Date   colonscopy  2017, may 2021   dr Loreta Ave   HEMORRHOID SURGERY N/A 05/11/2020   Procedure: SINGLE COLUMN HEMORRHOIDECTOMY, HEMORRHOIDAL PEXY;  Surgeon: Romie Levee, MD;  Location: [facility];  Service: General;  Laterality: N/A;   KNEE SURGERY Left 1998   TUBAL LIGATION     Social History   Socioeconomic History   Marital status: Single    Spouse name: Not on file   Number of children: Not on file   Years of education: Not on file   Highest education level: Not on file  Occupational History   Not on file  Tobacco Use   Smoking status: Never    Passive exposure: Never   Smokeless tobacco: Never  Vaping Use   Vaping status: Never Used  Substance and Sexual Activity   Alcohol use: Yes    Comment: occ once a month wine   Drug use: Never   Sexual activity: Not on file  Other Topics Concern   Not on file  Social History Narrative   Not  on file   Social Drivers of Health   Financial Resource Strain: Not on File (02/15/2022)   Received from [employer], [place] Strain    Financial Resource Strain: 0  Food Insecurity: Low Risk  (10/01/2023)   Received from [facility]   Hunger Vital Sign    Worried About Running Out of Food in the Last Year: Never true    Ran Out of Food in the Last Year: Never true  Transportation Needs: No Transportation Needs (10/01/2023)   Received from [facility]    In the past 12 months, has lack of reliable transportation kept you from medical appointments, meetings, work or from getting things needed for daily living? : No  Physical Activity: Not on File (02/15/2022)   Received from [place], [place]   Physical Activity    Physical Activity: 0  Stress: Not on File (02/15/2022)    Received from [facility], [place]   Stress    Stress: 0  Social Connections: Not on File (05/10/2023)   Received from [employer]   Social Connections    Connectedness: 0  Intimate Partner Violence: Not on file   Family Status  Relation Name Status   Mother  (Not Specified)  No partnership data on file   Family History  Problem Relation Age of Onset   Hypertension Mother    Allergies  Allergen Reactions   Olive Oil Swelling    hives   Propofol     Prefers not to use due to stomach pain after 2017 colonscopy    Patient Care Team: Alexa Golebiewski, Bonna Gains, NP as PCP - General (Internal Medicine) Pollyann Savoy, MD as Consulting Physician (Rheumatology)   Medications: Outpatient Medications Prior to Visit  Medication Sig   atomoxetine (STRATTERA) 25 MG capsule Take 25 mg by mouth daily.   levonorgestrel (MIRENA) 20 MCG/24HR IUD 1 each by Intrauterine route once.   meloxicam (MOBIC) 15 MG tablet Take 15 mg by mouth 3 (three) times daily.   Multiple Vitamin (MULTIVITAMIN WITH MINERALS) TABS tablet Take 1 tablet by mouth daily. Liquid 1 table spoon a day   Turmeric Curcumin 500 MG CAPS Take 500 mg by mouth. Takes 3 times per week.   [DISCONTINUED] amoxicillin-clavulanate (AUGMENTIN) 875-125 MG tablet Take 1 tablet by mouth 2 (two) times daily. (Patient not taking: Reported on 10/17/2023)   [DISCONTINUED] cyanocobalamin (,VITAMIN B-12,) 1000 MCG/ML injection Inject 1 mL (1,000 mcg total) into the muscle. weekly x 4weeks, then monthly. (Patient not taking: Reported on 10/17/2023)   [DISCONTINUED] folic acid (FOLVITE) 1 MG tablet Take 1 mg by mouth daily. (Patient not taking: Reported on 10/17/2023)   [DISCONTINUED] hydroxychloroquine (PLAQUENIL) 200 MG tablet Take by mouth. (Patient not taking: Reported on 10/17/2023)   [DISCONTINUED] methotrexate (RHEUMATREX) 2.5 MG tablet Take 10 mg by mouth once a week. (Patient not taking: Reported on 10/17/2023)   [DISCONTINUED] predniSONE (DELTASONE) 10 MG  tablet Take 10 mg by mouth daily. (Patient not taking: Reported on 10/17/2023)   No facility-administered medications prior to visit.    Review of Systems  Constitutional:  Negative for activity change, appetite change and unexpected weight change.  Respiratory: Negative.    Cardiovascular: Negative.   Gastrointestinal: Negative.   Endocrine: Negative for cold intolerance and heat intolerance.  Genitourinary: Negative.   Musculoskeletal: Negative.   Skin: Negative.   Neurological: Negative.   Hematological: Negative.   Psychiatric/Behavioral:  Negative for behavioral problems, decreased concentration, dysphoric mood, hallucinations, self-injury, sleep disturbance and  suicidal ideas. The patient is not nervous/anxious.         Objective:  BP 114/68   Pulse 63   Temp 98 F (36.7 C) (Temporal)   Ht 5' 5.5" (1.664 m)   Wt 125 lb 9.6 oz (57 kg)   SpO2 98%   BMI 20.58 kg/m     Physical Exam Vitals and nursing note reviewed.  Constitutional:      General: She is not in acute distress. HENT:     Right Ear: Tympanic membrane, ear canal and external ear normal.     Left Ear: Tympanic membrane, ear canal and external ear normal.     Nose: Nose normal.  Eyes:     Extraocular Movements: Extraocular movements intact.     Conjunctiva/sclera: Conjunctivae normal.     Pupils: Pupils are equal, round, and reactive to light.  Neck:     Thyroid: No thyroid mass, thyromegaly or thyroid tenderness.  Cardiovascular:     Rate and Rhythm: Normal rate and regular rhythm.     Pulses: Normal pulses.     Heart sounds: Normal heart sounds.  Pulmonary:     Effort: Pulmonary effort is normal.     Breath sounds: Normal breath sounds.  Abdominal:     General: Bowel sounds are normal.     Palpations: Abdomen is soft.  Musculoskeletal:        General: Normal range of motion.     Cervical back: Normal range of motion and neck supple.     Right lower leg: No edema.     Left lower leg: No edema.   Lymphadenopathy:     Cervical: No cervical adenopathy.  Skin:    General: Skin is warm and dry.  Neurological:     Mental Status: She is alert and oriented to person, place, and time.     Cranial Nerves: No cranial nerve deficit.  Psychiatric:        Mood and Affect: Mood normal.        Behavior: Behavior normal.        Thought Content: Thought content normal.     No results found for any visits on 10/17/23.    Assessment & Plan:    Routine Health Maintenance and Physical Exam  Immunization History  Administered Date(s) Administered   Moderna Sars-Covid-2 Vaccination 11/09/2019, 12/07/2019   PPD Test 10/24/2020   Tdap 10/10/2021   Health Maintenance  Topic Date Due   COVID-19 Vaccine (3 - Moderna risk series) 11/02/2023 (Originally 01/04/2020)   INFLUENZA VACCINE  11/24/2023 (Originally 03/27/2023)   Cervical Cancer Screening (HPV/Pap Cotest)  12/31/2027   DTaP/Tdap/Td (2 - Td or Tdap) 10/11/2031   Hepatitis C Screening  Completed   HIV Screening  Completed   HPV VACCINES  Aged Out   Discussed health benefits of physical activity, and encouraged her to engage in regular exercise appropriate for her age and condition.  Problem List Items Addressed This Visit     Iron deficiency anemia due to chronic blood loss   Repeat cbc      Relevant Orders   CBC   Rheumatoid arthritis (HCC)   Followed by Dr. Kathi Ludwig,  methotrexate, prednisone and plaquenil were discontinued Current use of mobic only      Other Visit Diagnoses       Preventative health care    -  Primary   Relevant Orders   Lipid panel   Comprehensive metabolic panel     Encounter for lipid screening  for cardiovascular disease       Relevant Orders   Lipid panel     Encounter for screening for viral disease       Relevant Orders   Urine cytology ancillary only   RPR   HIV Antibody (routine testing w rflx)   Hepatitis C antibody   Hep B Core Ab W/Reflex     Impaired fasting glucose       Relevant  Orders   Hemoglobin A1c     Chronic ulcerative colitis without complication, unspecified location (HCC)          Return in about 1 year (around 10/16/2024) for CPE (fasting).     Alysia Penna, NP

## 2023-10-18 LAB — RPR: RPR Ser Ql: NONREACTIVE

## 2023-10-18 LAB — HEPATITIS B CORE AB W/REFLEX: Hep B Core Total Ab: NEGATIVE

## 2023-10-18 LAB — HEPATITIS C ANTIBODY: Hepatitis C Ab: NONREACTIVE

## 2023-10-18 LAB — HIV ANTIBODY (ROUTINE TESTING W REFLEX): HIV 1&2 Ab, 4th Generation: NONREACTIVE

## 2023-10-20 ENCOUNTER — Encounter: Payer: Self-pay | Admitting: Nurse Practitioner

## 2023-10-20 LAB — URINE CYTOLOGY ANCILLARY ONLY
Chlamydia: NEGATIVE
Comment: NEGATIVE
Comment: NEGATIVE
Comment: NORMAL
Neisseria Gonorrhea: NEGATIVE
Trichomonas: NEGATIVE

## 2023-10-20 NOTE — Progress Notes (Signed)
Stable Follow instructions as discussed during office visit.

## 2023-10-25 DIAGNOSIS — Z419 Encounter for procedure for purposes other than remedying health state, unspecified: Secondary | ICD-10-CM | POA: Diagnosis not present

## 2023-11-10 ENCOUNTER — Telehealth: Payer: Self-pay | Admitting: Nurse Practitioner

## 2023-11-10 ENCOUNTER — Telehealth: Payer: Self-pay

## 2023-11-10 DIAGNOSIS — Z111 Encounter for screening for respiratory tuberculosis: Secondary | ICD-10-CM

## 2023-11-10 NOTE — Telephone Encounter (Signed)
 Received forms from front office in provider's folder. Placed in provider's folder in her office to review and sign

## 2023-11-10 NOTE — Telephone Encounter (Unsigned)
 Copied from CRM [id number]. Topic: General - Other >> Nov 10, 2023 10:51 AM Theodis Sato wrote: Reason for CRM: Patient states she would like to come in today to drop off forms for Alysia Penna to fill out by 3/25 that are required by her new employer. Advised patient paperwork can take 7-10 business days to complete. >> Nov 10, 2023 11:26 AM Einar Gip wrote: LVM to drop forms off at the office.

## 2023-11-10 NOTE — Telephone Encounter (Signed)
 Patient dropped off document  pre employment cpe , to be filled out by provider. Patient requested to send it back via Call Patient to pick up within 7-days. Document is located in providers tray at front office.Please advise at Mobile [phone] (mobile)   Pt walked in with form to be filled out. I put in the dr box

## 2023-11-10 NOTE — Telephone Encounter (Signed)
 Please advise  Copied from CRM [id number]. Topic: Clinical - Request for Lab/Test Order >> Nov 10, 2023 10:50 AM Toni Stafford wrote: Reason for CRM: Patient is requesting [name] to place a order for a TB test required by her new employer.

## 2023-11-11 DIAGNOSIS — Z0279 Encounter for issue of other medical certificate: Secondary | ICD-10-CM

## 2023-11-11 NOTE — Telephone Encounter (Signed)
 See other telephone encounter. Forms picked up from font office from providers folder and placed in folder in providers office to review

## 2023-11-12 ENCOUNTER — Other Ambulatory Visit (INDEPENDENT_AMBULATORY_CARE_PROVIDER_SITE_OTHER)

## 2023-11-12 DIAGNOSIS — Z111 Encounter for screening for respiratory tuberculosis: Secondary | ICD-10-CM | POA: Diagnosis not present

## 2023-11-12 NOTE — Addendum Note (Signed)
 Addended by: Larey Dresser on: 11/12/2023 08:59 AM   Modules accepted: Orders

## 2023-11-12 NOTE — Telephone Encounter (Signed)
 Pt is here for a lab appointment for TB - no order placed. Pt states she needs the results tomorrow. Explained that neither skin test or blood work will be resulted tomorrow. Pt opted for blood work. PCP approved order to be placed. Pt answered TB Questionnaire on paperwork for work and sent to the lab.

## 2023-11-12 NOTE — Telephone Encounter (Signed)
 Pt completed TB Questionnaire on TB screening form. Form returned to [facility] desk with 2nd part of form for completion and provider signature.

## 2023-11-18 ENCOUNTER — Encounter: Payer: Self-pay | Admitting: Nurse Practitioner

## 2023-11-18 LAB — QUANTIFERON-TB GOLD PLUS
Mitogen-NIL: 9.7 [IU]/mL
NIL: 0.04 [IU]/mL
QuantiFERON-TB Gold Plus: NEGATIVE
TB1-NIL: 0.03 [IU]/mL
TB2-NIL: 0.03 [IU]/mL

## 2023-12-06 DIAGNOSIS — Z419 Encounter for procedure for purposes other than remedying health state, unspecified: Secondary | ICD-10-CM | POA: Diagnosis not present

## 2024-01-05 DIAGNOSIS — Z419 Encounter for procedure for purposes other than remedying health state, unspecified: Secondary | ICD-10-CM | POA: Diagnosis not present

## 2024-02-05 DIAGNOSIS — Z419 Encounter for procedure for purposes other than remedying health state, unspecified: Secondary | ICD-10-CM | POA: Diagnosis not present

## 2024-03-06 DIAGNOSIS — Z419 Encounter for procedure for purposes other than remedying health state, unspecified: Secondary | ICD-10-CM | POA: Diagnosis not present

## 2024-04-06 DIAGNOSIS — Z419 Encounter for procedure for purposes other than remedying health state, unspecified: Secondary | ICD-10-CM | POA: Diagnosis not present

## 2024-05-07 DIAGNOSIS — Z419 Encounter for procedure for purposes other than remedying health state, unspecified: Secondary | ICD-10-CM | POA: Diagnosis not present

## 2024-10-18 ENCOUNTER — Encounter: Payer: Managed Care, Other (non HMO) | Admitting: Nurse Practitioner

## 2024-11-09 ENCOUNTER — Encounter: Admitting: Nurse Practitioner
